# Patient Record
Sex: Female | Born: 1970
Health system: Southern US, Community
[De-identification: ages and names within clinical notes are randomized; demographics above are authoritative.]

## PROBLEM LIST (undated history)

## (undated) DIAGNOSIS — D649 Anemia, unspecified: Secondary | ICD-10-CM

## (undated) DIAGNOSIS — Z8759 Personal history of other complications of pregnancy, childbirth and the puerperium: Secondary | ICD-10-CM

## (undated) DIAGNOSIS — E559 Vitamin D deficiency, unspecified: Secondary | ICD-10-CM

## (undated) DIAGNOSIS — E119 Type 2 diabetes mellitus without complications: Secondary | ICD-10-CM

## (undated) DIAGNOSIS — J45909 Unspecified asthma, uncomplicated: Secondary | ICD-10-CM

## (undated) DIAGNOSIS — J309 Allergic rhinitis, unspecified: Secondary | ICD-10-CM

## (undated) DIAGNOSIS — N946 Dysmenorrhea, unspecified: Secondary | ICD-10-CM

## (undated) HISTORY — DX: Anemia, unspecified: D64.9

## (undated) HISTORY — PX: NECK EXPLORATION: SHX2077

## (undated) HISTORY — DX: Type 2 diabetes mellitus without complications: E11.9

## (undated) HISTORY — DX: Personal history of other complications of pregnancy, childbirth and the puerperium: Z87.59

## (undated) HISTORY — DX: Allergic rhinitis, unspecified: J30.9

## (undated) HISTORY — PX: KNEE ARTHROSCOPY: SHX127

## (undated) HISTORY — DX: Vitamin D deficiency, unspecified: E55.9

## (undated) HISTORY — PX: PARATHYROIDECTOMY: SHX19

## (undated) HISTORY — PX: TUBAL LIGATION: SHX77

## (undated) HISTORY — DX: Unspecified asthma, uncomplicated: J45.909

## (undated) HISTORY — PX: BREAST CYST ASPIRATION: SHX578

## (undated) HISTORY — DX: Dysmenorrhea, unspecified: N94.6

---

## 2006-02-16 ENCOUNTER — Inpatient Hospital Stay: Payer: Self-pay | Admitting: Obstetrics & Gynecology

## 2007-02-02 ENCOUNTER — Ambulatory Visit: Payer: Self-pay | Admitting: Family Medicine

## 2007-03-30 ENCOUNTER — Ambulatory Visit: Payer: Self-pay | Admitting: Obstetrics & Gynecology

## 2007-04-01 ENCOUNTER — Ambulatory Visit: Payer: Self-pay | Admitting: Family Medicine

## 2010-03-25 ENCOUNTER — Ambulatory Visit: Payer: Self-pay | Admitting: Unknown Physician Specialty

## 2011-01-08 ENCOUNTER — Ambulatory Visit: Payer: Self-pay | Admitting: Family Medicine

## 2011-07-21 ENCOUNTER — Ambulatory Visit: Payer: Self-pay | Admitting: Family Medicine

## 2012-07-26 ENCOUNTER — Ambulatory Visit: Payer: Self-pay | Admitting: Family Medicine

## 2013-09-06 ENCOUNTER — Ambulatory Visit: Payer: Self-pay | Admitting: Family Medicine

## 2013-11-08 ENCOUNTER — Ambulatory Visit: Payer: Self-pay | Admitting: Podiatry

## 2013-11-14 ENCOUNTER — Ambulatory Visit (INDEPENDENT_AMBULATORY_CARE_PROVIDER_SITE_OTHER): Payer: 59 | Admitting: Podiatry

## 2013-11-14 ENCOUNTER — Encounter: Payer: Self-pay | Admitting: Podiatry

## 2013-11-14 ENCOUNTER — Ambulatory Visit (INDEPENDENT_AMBULATORY_CARE_PROVIDER_SITE_OTHER): Payer: 59

## 2013-11-14 VITALS — BP 103/68 | HR 67 | Resp 16 | Ht 66.0 in | Wt 125.0 lb

## 2013-11-14 DIAGNOSIS — M722 Plantar fascial fibromatosis: Secondary | ICD-10-CM

## 2013-11-14 MED ORDER — MELOXICAM 15 MG PO TABS: 15.0000 mg | ORAL_TABLET | Freq: Every day | ORAL | Status: DC

## 2013-11-14 NOTE — Progress Notes (Signed)
   Subjective:    Patient ID: Tanya Adams, female    DOB: 12-23-1970, 43 y.o.   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 She has pain in her heel which started in August and has been improving since then. She states that she has pain in the area after running, walking,. His standing. She also states that she has pain in the mornings or after periods of rest. She has taken Mobic previously as well as icing and stretching. She denies any specific injury or trauma to the area. No with complaints at this time.  Patient states she currently has a cold.   Foot Pain Associated symptoms include coughing.      Review of Systems  HENT:       Sore throat  Respiratory: Positive for cough.   Musculoskeletal:       Joint pain Difficulty walking   Allergic/Immunologic: Positive for environmental allergies.  All other systems reviewed and are negative.      Objective:   Physical Exam AAO x3, NAD DP/PT pulses palpable bilaterally, CRT less than 3 seconds Protective sensation intact with Simms Weinstein monofilament, vibratory sensation intact, Achilles tendon reflex intact Tenderness to palpation over the plantar medial tubercle of the calcaneus and the left foot at the insertion the plantar fascia. There is no pain along the course of plantar fascial in the arch of the foot. There is no pain with lateral compression of the calcaneus or pain with vibratory sensation. No pain on the posterior aspect of the calcaneus or along the course of the Achilles tendon. No overlying edema, erythema, increased warmth. MMT 5/5, ROM WNL No open lesions. No calf pain, swelling, warmth, erythema.        Assessment & Plan:  43 year old female with left heel pain, likely plantar fasciitis. -X-rays were obtained and reviewed with the patient. -Conservative versus surgical treatment  discussed including alternatives, risks, complications. -At this time patient wishes to hold off a steroid injection as the area has been improving. -Discussed stretching exercises. -Ice to the affected area. -Discussed shoe gear modification the possible inserts. -Prescribed Mobic. Side effects the medication were discussed with the patient and directed to stop immediately if any are to occur and call the office. -Dispensed plantar fascial brace. -Follow-up in 3 weeks or sooner if any problems are to arise. In the meantime, call the office in the questions, concerns, change in symptoms.

## 2013-11-14 NOTE — Patient Instructions (Signed)
Plantar Fasciitis (Heel Spur Syndrome) with Rehab The plantar fascia is a fibrous, ligament-like, soft-tissue structure that spans the bottom of the foot. Plantar fasciitis is a condition that causes pain in the foot due to inflammation of the tissue. SYMPTOMS   Pain and tenderness on the underneath side of the foot.  Pain that worsens with standing or walking. CAUSES  Plantar fasciitis is caused by irritation and injury to the plantar fascia on the underneath side of the foot. Common mechanisms of injury include:  Direct trauma to bottom of the foot.  Damage to a small nerve that runs under the foot where the main fascia attaches to the heel bone.  Stress placed on the plantar fascia due to bone spurs. RISK INCREASES WITH:   Activities that place stress on the plantar fascia (running, jumping, pivoting, or cutting).  Poor strength and flexibility.  Improperly fitted shoes.  Tight calf muscles.  Flat feet.  Failure to warm-up properly before activity.  Obesity. PREVENTION  Warm up and stretch properly before activity.  Allow for adequate recovery between workouts.  Maintain physical fitness:  Strength, flexibility, and endurance.  Cardiovascular fitness.  Maintain a health body weight.  Avoid stress on the plantar fascia.  Wear properly fitted shoes, including arch supports for individuals who have flat feet. PROGNOSIS  If treated properly, then the symptoms of plantar fasciitis usually resolve without surgery. However, occasionally surgery is necessary. RELATED COMPLICATIONS   Recurrent symptoms that may result in a chronic condition.  Problems of the lower back that are caused by compensating for the injury, such as limping.  Pain or weakness of the foot during push-off following surgery.  Chronic inflammation, scarring, and partial or complete fascia tear, occurring more often from repeated injections. TREATMENT  Treatment initially involves the use of  ice and medication to help reduce pain and inflammation. The use of strengthening and stretching exercises may help reduce pain with activity, especially stretches of the Achilles tendon. These exercises may be performed at home or with a therapist. Your caregiver may recommend that you use heel cups of arch supports to help reduce stress on the plantar fascia. Occasionally, corticosteroid injections are given to reduce inflammation. If symptoms persist for greater than 6 months despite non-surgical (conservative), then surgery may be recommended.  MEDICATION   If pain medication is necessary, then nonsteroidal anti-inflammatory medications, such as aspirin and ibuprofen, or other minor pain relievers, such as acetaminophen, are often recommended.  Do not take pain medication within 7 days before surgery.  Prescription pain relievers may be given if deemed necessary by your caregiver. Use only as directed and only as much as you need.  Corticosteroid injections may be given by your caregiver. These injections should be reserved for the most serious cases, because they may only be given a certain number of times. HEAT AND COLD  Cold treatment (icing) relieves pain and reduces inflammation. Cold treatment should be applied for 10 to 15 minutes every 2 to 3 hours for inflammation and pain and immediately after any activity that aggravates your symptoms. Use ice packs or massage the area with a piece of ice (ice massage).  Heat treatment may be used prior to performing the stretching and strengthening activities prescribed by your caregiver, physical therapist, or athletic trainer. Use a heat pack or soak the injury in warm water. SEEK IMMEDIATE MEDICAL CARE IF:  Treatment seems to offer no benefit, or the condition worsens.  Any medications produce adverse side effects. EXERCISES RANGE   OF MOTION (ROM) AND STRETCHING EXERCISES - Plantar Fasciitis (Heel Spur Syndrome) These exercises may help you  when beginning to rehabilitate your injury. Your symptoms may resolve with or without further involvement from your physician, physical therapist or athletic trainer. While completing these exercises, remember:   Restoring tissue flexibility helps normal motion to return to the joints. This allows healthier, less painful movement and activity.  An effective stretch should be held for at least 30 seconds.  A stretch should never be painful. You should only feel a gentle lengthening or release in the stretched tissue. RANGE OF MOTION - Toe Extension, Flexion  Sit with your right / left leg crossed over your opposite knee.  Grasp your toes and gently pull them back toward the top of your foot. You should feel a stretch on the bottom of your toes and/or foot.  Hold this stretch for __________ seconds.  Now, gently pull your toes toward the bottom of your foot. You should feel a stretch on the top of your toes and or foot.  Hold this stretch for __________ seconds. Repeat __________ times. Complete this stretch __________ times per day.  RANGE OF MOTION - Ankle Dorsiflexion, Active Assisted  Remove shoes and sit on a chair that is preferably not on a carpeted surface.  Place right / left foot under knee. Extend your opposite leg for support.  Keeping your heel down, slide your right / left foot back toward the chair until you feel a stretch at your ankle or calf. If you do not feel a stretch, slide your bottom forward to the edge of the chair, while still keeping your heel down.  Hold this stretch for __________ seconds. Repeat __________ times. Complete this stretch __________ times per day.  STRETCH - Gastroc, Standing  Place hands on wall.  Extend right / left leg, keeping the front knee somewhat bent.  Slightly point your toes inward on your back foot.  Keeping your right / left heel on the floor and your knee straight, shift your weight toward the wall, not allowing your back to  arch.  You should feel a gentle stretch in the right / left calf. Hold this position for __________ seconds. Repeat __________ times. Complete this stretch __________ times per day. STRETCH - Soleus, Standing  Place hands on wall.  Extend right / left leg, keeping the other knee somewhat bent.  Slightly point your toes inward on your back foot.  Keep your right / left heel on the floor, bend your back knee, and slightly shift your weight over the back leg so that you feel a gentle stretch deep in your back calf.  Hold this position for __________ seconds. Repeat __________ times. Complete this stretch __________ times per day. STRETCH - Gastrocsoleus, Standing  Note: This exercise can place a lot of stress on your foot and ankle. Please complete this exercise only if specifically instructed by your caregiver.   Place the ball of your right / left foot on a step, keeping your other foot firmly on the same step.  Hold on to the wall or a rail for balance.  Slowly lift your other foot, allowing your body weight to press your heel down over the edge of the step.  You should feel a stretch in your right / left calf.  Hold this position for __________ seconds.  Repeat this exercise with a slight bend in your right / left knee. Repeat __________ times. Complete this stretch __________ times per day.    STRENGTHENING EXERCISES - Plantar Fasciitis (Heel Spur Syndrome)  These exercises may help you when beginning to rehabilitate your injury. They may resolve your symptoms with or without further involvement from your physician, physical therapist or athletic trainer. While completing these exercises, remember:   Muscles can gain both the endurance and the strength needed for everyday activities through controlled exercises.  Complete these exercises as instructed by your physician, physical therapist or athletic trainer. Progress the resistance and repetitions only as guided. STRENGTH -  Towel Curls  Sit in a chair positioned on a non-carpeted surface.  Place your foot on a towel, keeping your heel on the floor.  Pull the towel toward your heel by only curling your toes. Keep your heel on the floor.  If instructed by your physician, physical therapist or athletic trainer, add ____________________ at the end of the towel. Repeat __________ times. Complete this exercise __________ times per day. STRENGTH - Ankle Inversion  Secure one end of a rubber exercise band/tubing to a fixed object (table, pole). Loop the other end around your foot just before your toes.  Place your fists between your knees. This will focus your strengthening at your ankle.  Slowly, pull your big toe up and in, making sure the band/tubing is positioned to resist the entire motion.  Hold this position for __________ seconds.  Have your muscles resist the band/tubing as it slowly pulls your foot back to the starting position. Repeat __________ times. Complete this exercises __________ times per day.  Document Released: 12/29/2004 Document Revised: 03/23/2011 Document Reviewed: 04/12/2008 [employer] Patient Information 2015 [employer]. This information is not intended to replace advice given to you by your health care provider. Make sure you discuss any questions you have with your health care provider.  

## 2014-02-22 DIAGNOSIS — Z862 Personal history of diseases of the blood and blood-forming organs and certain disorders involving the immune mechanism: Secondary | ICD-10-CM | POA: Insufficient documentation

## 2014-03-19 ENCOUNTER — Ambulatory Visit: Payer: Self-pay | Admitting: Gastroenterology

## 2014-03-21 ENCOUNTER — Ambulatory Visit: Payer: Self-pay | Admitting: Gastroenterology

## 2014-04-18 LAB — BASIC METABOLIC PANEL
BUN: 9 mg/dL (ref 4–21)
Creatinine: 0.6 mg/dL (ref 0.5–1.1)
Glucose: 101 mg/dL
Potassium: 3.7 mmol/L (ref 3.4–5.3)
SODIUM: 144 mmol/L (ref 137–147)

## 2014-04-18 LAB — LIPID PANEL
Cholesterol: 159 mg/dL (ref 0–200)
HDL: 50 mg/dL (ref 35–70)
LDL Cholesterol: 99 mg/dL
Triglycerides: 51 mg/dL (ref 40–160)

## 2014-04-18 LAB — CBC AND DIFFERENTIAL
HEMATOCRIT: 37 % (ref 36–46)
Hemoglobin: 12.5 g/dL (ref 12.0–16.0)
NEUTROS ABS: 3 /uL
Platelets: 163 10*3/uL (ref 150–399)
WBC: 5.2 10^3/mL

## 2014-04-18 LAB — HEPATIC FUNCTION PANEL
ALT: 19 U/L (ref 7–35)
AST: 25 U/L (ref 13–35)
Alkaline Phosphatase: 32 U/L (ref 25–125)
BILIRUBIN, TOTAL: 0.7 mg/dL

## 2014-04-18 LAB — TSH: TSH: 1.44 u[IU]/mL (ref 0.41–5.90)

## 2014-07-31 ENCOUNTER — Telehealth: Payer: Self-pay | Admitting: Family Medicine

## 2014-07-31 ENCOUNTER — Other Ambulatory Visit: Payer: Self-pay

## 2014-07-31 DIAGNOSIS — J309 Allergic rhinitis, unspecified: Secondary | ICD-10-CM

## 2014-07-31 MED ORDER — AMOXICILLIN-POT CLAVULANATE 875-125 MG PO TABS: 1.0000 | ORAL_TABLET | Freq: Two times a day (BID) | ORAL | Status: DC

## 2014-07-31 MED ORDER — CIPROFLOXACIN HCL 500 MG PO TABS: 500.0000 mg | ORAL_TABLET | Freq: Two times a day (BID) | ORAL | Status: DC

## 2014-07-31 MED ORDER — MONTELUKAST SODIUM 10 MG PO TABS: 10.0000 mg | ORAL_TABLET | Freq: Every day | ORAL | Status: DC

## 2014-07-31 NOTE — Telephone Encounter (Signed)
Pt is requesting Augmenti 875 #20 and Cipro #14, she stated she was going to be gone to [place] for 2 wks.

## 2015-02-11 ENCOUNTER — Telehealth: Payer: Self-pay | Admitting: Family Medicine

## 2015-02-11 DIAGNOSIS — Z1239 Encounter for other screening for malignant neoplasm of breast: Secondary | ICD-10-CM

## 2015-02-11 NOTE — Telephone Encounter (Signed)
Order added.    Thanks,   -Laura  

## 2015-02-11 NOTE — Telephone Encounter (Signed)
Ok to put in order for screening mammogram. Thanks.

## 2015-02-13 ENCOUNTER — Ambulatory Visit
Admission: RE | Admit: 2015-02-13 | Discharge: 2015-02-13 | Disposition: A | Discharge status: Home / Self Care | Payer: 59 | Source: Ambulatory Visit | Attending: Family Medicine | Admitting: Family Medicine

## 2015-02-13 DIAGNOSIS — Z1231 Encounter for screening mammogram for malignant neoplasm of breast: Secondary | ICD-10-CM | POA: Insufficient documentation

## 2015-02-15 DIAGNOSIS — E892 Postprocedural hypoparathyroidism: Secondary | ICD-10-CM | POA: Insufficient documentation

## 2015-02-15 DIAGNOSIS — Z9851 Tubal ligation status: Secondary | ICD-10-CM | POA: Insufficient documentation

## 2015-02-15 DIAGNOSIS — E559 Vitamin D deficiency, unspecified: Secondary | ICD-10-CM | POA: Insufficient documentation

## 2015-02-15 DIAGNOSIS — Z9009 Acquired absence of other part of head and neck: Secondary | ICD-10-CM | POA: Insufficient documentation

## 2015-02-15 DIAGNOSIS — K2100 Gastro-esophageal reflux disease with esophagitis, without bleeding: Secondary | ICD-10-CM | POA: Insufficient documentation

## 2015-02-15 DIAGNOSIS — J45909 Unspecified asthma, uncomplicated: Secondary | ICD-10-CM | POA: Insufficient documentation

## 2015-02-15 DIAGNOSIS — D509 Iron deficiency anemia, unspecified: Secondary | ICD-10-CM | POA: Insufficient documentation

## 2015-02-15 DIAGNOSIS — M722 Plantar fascial fibromatosis: Secondary | ICD-10-CM | POA: Insufficient documentation

## 2015-03-12 ENCOUNTER — Ambulatory Visit (INDEPENDENT_AMBULATORY_CARE_PROVIDER_SITE_OTHER): Payer: 59 | Admitting: Family Medicine

## 2015-03-12 ENCOUNTER — Encounter: Payer: Self-pay | Admitting: Family Medicine

## 2015-03-12 VITALS — BP 96/62 | HR 67 | Temp 98.7°F | Resp 16 | Ht 64.0 in | Wt 128.0 lb

## 2015-03-12 DIAGNOSIS — E559 Vitamin D deficiency, unspecified: Secondary | ICD-10-CM | POA: Diagnosis not present

## 2015-03-12 DIAGNOSIS — Z0000 Encounter for general adult medical examination without abnormal findings: Secondary | ICD-10-CM

## 2015-03-12 NOTE — Progress Notes (Signed)
Patient ID: LOUINE SCHALK, female   DOB: November 15, 1970, 45 y.o.   MRN: [id number]       Patient: Tanya Adams, Female    DOB: November 09, 1970, 45 y.o.   MRN: [id number] Visit Date: 03/13/2015  Today's Provider: Margarita Rana, MD   Chief Complaint  Patient presents with  . Annual Exam   Subjective:    Annual physical exam Tanya Adams is a 45 y.o. female who presents today for health maintenance and complete physical. She feels well. She reports exercising 6 days a week. She reports she is sleeping well. 03/07/14 CPE 03/07/14 Pap-neg; HPV-neg 02/14/15 Mammogram-BI-RADS 1 03/19/14 Colonoscopy-normal  -----------------------------------------------------------------   Review of Systems  Constitutional: Negative.   HENT: Positive for congestion.   Eyes: Negative.   Respiratory: Negative.   Cardiovascular: Negative.   Gastrointestinal: Negative.   Endocrine: Negative.   Genitourinary: Negative.   Musculoskeletal: Positive for back pain.  Allergic/Immunologic: Positive for environmental allergies.  Neurological: Negative.   Hematological: Negative.   Psychiatric/Behavioral: Negative.     Social History      She  reports that she has never smoked. She has never used smokeless tobacco. She reports that she drinks alcohol. She reports that she does not use illicit drugs.       Social History   Social History  . Marital Status: Married    Spouse Name: N/A  . Number of Children: N/A  . Years of Education: N/A   Social History Main Topics  . Smoking status: Never Smoker   . Smokeless tobacco: Never Used  . Alcohol Use: 0.0 oz/week    0 Standard drinks or equivalent per week     Comment: seldom  . Drug Use: No  . Sexual Activity: Not Asked   Other Topics Concern  . None   Social History Narrative    History reviewed. No pertinent past medical history.   Patient Active Problem List   Diagnosis Date Noted  . Asthma with allergic rhinitis 02/15/2015  .  Esophagitis, reflux 02/15/2015  . H/O tubal ligation 02/15/2015  . Avitaminosis D 02/15/2015  . Allergic rhinitis 07/31/2014  . History of anemia 02/22/2014    Past Surgical History  Procedure Laterality Date  . Breast cyst aspiration Right   . Parathyroidectomy    . Neck exploration    . Knee arthroscopy    . Cesarean section    . Tubal ligation      Family History        Family Status  Relation Status Death Age  . Mother Alive   . Father Alive   . Brother Alive         Her family history includes Benign prostatic hyperplasia in her father; Depression in her mother; Diabetes in her father and mother; Hypertension in her mother. There is no history of Breast cancer.    Allergies  Allergen Reactions  . Shellfish Allergy Anaphylaxis  . Tape Rash    Previous Medications   CHOLECALCIFEROL (VITAMIN D) 2000 UNITS CAPS    Take by mouth.   FERROUS SULFATE 324 (65 FE) MG TBEC    Take by mouth.   FLUTICASONE (FLONASE) 50 MCG/ACT NASAL SPRAY    Place into the nose.   LEVALBUTEROL (XOPENEX HFA) 45 MCG/ACT INHALER    Inhale into the lungs.   LORATADINE (CLARITIN) 10 MG TABLET    Take 10 mg by mouth daily.   MONTELUKAST (SINGULAIR) 10 MG TABLET    Take 1 tablet (10 mg  total) by mouth daily.    Patient Care Team: Margarita Rana, MD as PCP - General (Family Medicine)     Objective:   Vitals: BP 96/62 mmHg  Pulse 67  Temp(Src) 98.7 F (37.1 C) (Oral)  Resp 16  Ht 5\' 4"  (1.626 m)  Wt 128 lb (58.06 kg)  BMI 21.96 kg/m2  SpO2 97%  LMP 03/05/2015   Physical Exam  Constitutional: She is oriented to person, place, and time. She appears well-developed and well-nourished.  HENT:  Head: Normocephalic and atraumatic.  Right Ear: Tympanic membrane, external ear and ear canal normal.  Left Ear: Tympanic membrane, external ear and ear canal normal.  Nose: Nose normal.  Mouth/Throat: Uvula is midline, oropharynx is clear and moist and mucous membranes are normal.  Eyes:  Conjunctivae, EOM and lids are normal. Pupils are equal, round, and reactive to light.  Neck: Trachea normal and normal range of motion. Neck supple. Carotid bruit is not present. No thyroid mass and no thyromegaly present.  Cardiovascular: Normal rate, regular rhythm and normal heart sounds.   Pulmonary/Chest: Effort normal and breath sounds normal.  Abdominal: Soft. Normal appearance and bowel sounds are normal. There is no hepatosplenomegaly. There is no tenderness.  Genitourinary: No breast swelling, tenderness or discharge.  Musculoskeletal: Normal range of motion.  Lymphadenopathy:    She has no cervical adenopathy.    She has no axillary adenopathy.  Neurological: She is alert and oriented to person, place, and time. She has normal strength. No cranial nerve deficit.  Skin: Skin is warm, dry and intact.  Psychiatric: She has a normal mood and affect. Her speech is normal and behavior is normal. Judgment and thought content normal. Cognition and memory are normal.      Assessment & Plan:     Routine Health Maintenance and Physical Exam  Exercise Activities and Dietary recommendations Goals    None      Immunization History  Administered Date(s) Administered  . Influenza-Unspecified 10/02/2014    1. Annual physical exam Healthy. No lifestyle risk factors identified.  Continue to eat healthy and exercise. Will check labs  Has history of anemia.   - CBC with Differential/Platelet - Comprehensive Metabolic Panel (CMET) - TSH  2. Vitamin D deficiency Has history of low Vitamin D. Will check labs.   - VITAMIN D 25 Hydroxy (Vit-D Deficiency, Fractures)   Patient was seen and examined by Jerrell Belfast, MD, and note scribed by Lynford Humphrey, [place].   I have reviewed the document for accuracy and completeness and I agree with above. Jerrell Belfast, MD   Margarita Rana, MD    --------------------------------------------------------------------

## 2015-04-01 ENCOUNTER — Encounter: Payer: Self-pay | Admitting: Family Medicine

## 2015-04-01 ENCOUNTER — Other Ambulatory Visit: Payer: Self-pay | Admitting: Family Medicine

## 2015-04-01 MED ORDER — BECLOMETHASONE DIPROPIONATE 40 MCG/ACT IN AERS: 2.0000 | INHALATION_SPRAY | Freq: Two times a day (BID) | RESPIRATORY_TRACT | Status: DC

## 2015-04-01 MED ORDER — LEVALBUTEROL TARTRATE 45 MCG/ACT IN AERO: 1.0000 | INHALATION_SPRAY | RESPIRATORY_TRACT | Status: DC | PRN

## 2015-04-03 ENCOUNTER — Ambulatory Visit (INDEPENDENT_AMBULATORY_CARE_PROVIDER_SITE_OTHER): Payer: 59 | Admitting: Family Medicine

## 2015-04-03 ENCOUNTER — Encounter: Payer: Self-pay | Admitting: Family Medicine

## 2015-04-03 VITALS — BP 104/66 | HR 72 | Temp 98.4°F | Resp 12 | Wt 128.0 lb

## 2015-04-03 DIAGNOSIS — J45901 Unspecified asthma with (acute) exacerbation: Secondary | ICD-10-CM | POA: Diagnosis not present

## 2015-04-03 MED ORDER — PREDNISONE 10 MG PO TABS: ORAL_TABLET | ORAL | Status: DC

## 2015-04-03 MED ORDER — AZITHROMYCIN 250 MG PO TABS: ORAL_TABLET | ORAL | Status: DC

## 2015-04-03 MED ORDER — MOMETASONE FUROATE 220 MCG/INH IN AEPB: 2.0000 | INHALATION_SPRAY | Freq: Two times a day (BID) | RESPIRATORY_TRACT | Status: DC

## 2015-04-03 MED ORDER — FLUCONAZOLE 150 MG PO TABS: 150.0000 mg | ORAL_TABLET | Freq: Once | ORAL | Status: DC

## 2015-04-03 NOTE — Progress Notes (Signed)
Patient ID: Tanya Adams, female   DOB: Jan 28, 1970, 45 y.o.   MRN: [id number]       Patient: Tanya Adams Female    DOB: [birth year]   45 y.o.   MRN: [id number] Visit Date: 04/03/2015  Today's Provider: Margarita Rana, MD   Chief Complaint  Patient presents with  . Cough   Subjective:    HPI  Patient states she developed a cold about 1 month ago. She started to feel better but then the past 2 days she started to cough more-mainly dry and constant, she has had some yellowish phlegm come up with cough and wheezing last night. No fever.  She did Pulmicort and Xopenex nebulizer. Did not pick up Qvar secondary to cost She states she hs history of allergies and asthma. She is still taking Claritin and Singulair. No fever. Feels fatigue and achy though. Getting worse again. Just can not clear it. Is sleeping but is keeping her husband up.       Allergies  Allergen Reactions  . Shellfish Allergy Anaphylaxis  . Tape Rash   Previous Medications   BUDESONIDE (PULMICORT) 0.5 MG/2ML NEBULIZER SOLUTION    Take 0.5 mg by nebulization 2 (two) times daily.   CHOLECALCIFEROL (VITAMIN D) 2000 UNITS CAPS    Take by mouth.   FERROUS SULFATE 324 (65 FE) MG TBEC    Take by mouth.   FLUTICASONE (FLONASE) 50 MCG/ACT NASAL SPRAY    Place into the nose.   LEVALBUTEROL (XOPENEX HFA) 45 MCG/ACT INHALER    Inhale 1 puff into the lungs every 4 (four) hours as needed for wheezing.   LORATADINE (CLARITIN) 10 MG TABLET    Take 10 mg by mouth daily.   MONTELUKAST (SINGULAIR) 10 MG TABLET    Take 1 tablet (10 mg total) by mouth daily.    Review of Systems  Constitutional: Positive for fatigue. Negative for fever and chills.  HENT: Positive for congestion, postnasal drip and rhinorrhea. Negative for sinus pressure, sneezing, sore throat and trouble swallowing.   Eyes: Positive for discharge.  Respiratory: Positive for cough and wheezing.   Cardiovascular: Negative.     Social History  Substance Use Topics   . Smoking status: Never Smoker   . Smokeless tobacco: Never Used  . Alcohol Use: 0.0 oz/week    0 Standard drinks or equivalent per week     Comment: seldom   Objective:   BP 104/66 mmHg  Pulse 72  Temp(Src) 98.4 F (36.9 C)  Resp 12  Wt 128 lb (58.06 kg)  SpO2 99%  LMP 04/03/2015  Physical Exam  Constitutional: She is oriented to person, place, and time. She appears well-developed and well-nourished.  HENT:  Head: Normocephalic and atraumatic.  Right Ear: External ear normal.  Left Ear: External ear normal.  Mouth/Throat: Oropharynx is clear and moist.  Eyes: Conjunctivae and EOM are normal. Pupils are equal, round, and reactive to light.  Neck: Normal range of motion. Neck supple.  Cardiovascular: Normal rate and regular rhythm.   Pulmonary/Chest: Effort normal and breath sounds normal.  Neurological: She is alert and oriented to person, place, and time.  Psychiatric: She has a normal mood and affect. Her behavior is normal. Judgment and thought content normal.      Assessment & Plan:     1. Asthma exacerbation New problem. Will treat as below.  May need daily steroid inhaler. Will write for high does Asmanex and taper as tolerated. Will also call in Prednisone and  Zpak and Diflucan. Patient instructed to call back if condition worsens or does not improve.    - mometasone (ASMANEX 120 METERED DOSES) 220 MCG/INH inhaler; Inhale 2 puffs into the lungs 2 (two) times daily.  Dispense: 1 Inhaler; Refill: 12 - predniSONE (DELTASONE) 10 MG tablet; 6 po for 1 day and then 5 po for 1 day and then 4 po for 1 day and 3 po for 1 day and then 2 po for 1 day and then 1 po for 1 day.  Dispense: 21 tablet; Refill: 0 - azithromycin (ZITHROMAX) 250 MG tablet; 2 today and then one a day for 4 more days.  Dispense: 6 tablet; Refill: 0 - fluconazole (DIFLUCAN) 150 MG tablet; Take 1 tablet (150 mg total) by mouth once.  Dispense: 1 tablet; Refill: 0   Margarita Rana, MD       Margarita Rana,  MD  [facility]

## 2015-04-17 DIAGNOSIS — E559 Vitamin D deficiency, unspecified: Secondary | ICD-10-CM | POA: Diagnosis not present

## 2015-04-17 DIAGNOSIS — Z0000 Encounter for general adult medical examination without abnormal findings: Secondary | ICD-10-CM | POA: Diagnosis not present

## 2015-04-18 LAB — VITAMIN D 25 HYDROXY (VIT D DEFICIENCY, FRACTURES): VIT D 25 HYDROXY: 34.8 ng/mL (ref 30.0–100.0)

## 2015-05-20 ENCOUNTER — Encounter: Payer: Self-pay | Admitting: Family Medicine

## 2015-08-22 ENCOUNTER — Ambulatory Visit (INDEPENDENT_AMBULATORY_CARE_PROVIDER_SITE_OTHER): Payer: 59 | Admitting: Podiatry

## 2015-08-22 ENCOUNTER — Encounter: Payer: Self-pay | Admitting: Podiatry

## 2015-08-22 ENCOUNTER — Ambulatory Visit (INDEPENDENT_AMBULATORY_CARE_PROVIDER_SITE_OTHER): Payer: 59

## 2015-08-22 VITALS — BP 103/60 | HR 75 | Resp 18

## 2015-08-22 DIAGNOSIS — R52 Pain, unspecified: Secondary | ICD-10-CM

## 2015-08-22 DIAGNOSIS — M79671 Pain in right foot: Secondary | ICD-10-CM | POA: Diagnosis not present

## 2015-08-22 DIAGNOSIS — M779 Enthesopathy, unspecified: Secondary | ICD-10-CM | POA: Diagnosis not present

## 2015-08-22 MED ORDER — MELOXICAM 15 MG PO TABS: 15.0000 mg | ORAL_TABLET | Freq: Every day | ORAL | 2 refills | Status: DC

## 2015-08-22 NOTE — Progress Notes (Signed)
   Subjective:    Patient ID: Tanya Adams, female    DOB: 05/01/1970, 45 y.o.   MRN: [id number]  HPI  Tanya Adams presents to the office today for concerns Of right foot pain which is been ongoing for about 1 week. She describes as a sharp pain at times. She states that she has increased her running over the last 4 weeks training for relay race. She states that she has decreased running of the last couple days her foot is felt better. She denies any swelling or redness over the area and no numbness or tingling. She states that when she tries to bring her foot up she gets some discomfort and she points the dorsal lateral midfoot. No recent treatment otherwise. No other complaints at this time.   Review of Systems  All other systems reviewed and are negative.      Objective:   Physical Exam General: AAO x3, NAD  Dermatological: Skin is warm, dry and supple bilateral. Nails x 10 are well manicured; remaining integument appears unremarkable at this time. There are no open sores, no preulcerative lesions, no rash or signs of infection present.  Vascular: Dorsalis Pedis artery and Posterior Tibial artery pedal pulses are 2/4 bilateral with immedate capillary fill time. Pedal hair growth present. There is no pain with calf compression, swelling, warmth, erythema.   Neruologic: Grossly intact via light touch bilateral.   Musculoskeletal:  There is tenderness palpation along the dorsal lateral aspect of the midfoot approximately metatarsal cuneiform joint. There is no area pinpoint enters there is no pain vibratory sensation. There is no edema, erythema, increased warmth. Tendons appear to be intact. No other areas of tenderness bilaterally. Muscular strength 5/5 in all groups tested bilateral.  Gait: Unassisted, Nonantalgic.       Assessment & Plan:  45 year old female presents with right foot pain, capsulitis, tendinitis -Treatment options discussed including all alternatives, risks, and  complications -Etiology of symptoms were discussed -X-rays were obtained and reviewed with the patient. No evidence of acute fracture or stress fracture identified at this time. -I discussed a steroid injection. Maxalt tenderness and she wishes to proceed. Under sterile conditions a mixture of dexamethasone and local anesthetic was infiltrated into the area of maximal tenderness without complications. Post injection care was discussed. -Dispensed surgical shoe wear the next couple of days to help rest the foot. -Ice to the area -Prescribed mobic. Discussed side effects of the medication and directed to stop if any are to occur and call the office.  -Follow-up in 2 weeks or sooner if any problems arise. In the meantime, encouraged to call the office with any questions, concerns, change in symptoms.   Celesta Gentile, DPM

## 2015-09-05 ENCOUNTER — Ambulatory Visit: Payer: 59 | Admitting: Podiatry

## 2015-11-26 ENCOUNTER — Ambulatory Visit: Payer: Self-pay | Admitting: Physician Assistant

## 2015-11-28 ENCOUNTER — Other Ambulatory Visit: Payer: Self-pay

## 2015-11-28 ENCOUNTER — Ambulatory Visit: Payer: Self-pay | Admitting: Physician Assistant

## 2015-11-28 DIAGNOSIS — R059 Cough, unspecified: Secondary | ICD-10-CM

## 2015-11-28 DIAGNOSIS — J40 Bronchitis, not specified as acute or chronic: Secondary | ICD-10-CM

## 2015-11-28 DIAGNOSIS — R05 Cough: Secondary | ICD-10-CM

## 2015-11-28 MED ORDER — AZITHROMYCIN 250 MG PO TABS: ORAL_TABLET | ORAL | 2 refills | Status: DC

## 2015-11-28 MED ORDER — FLUCONAZOLE 150 MG PO TABS: 150.0000 mg | ORAL_TABLET | Freq: Once | ORAL | 2 refills | Status: AC

## 2015-11-28 MED ORDER — BENZONATATE 100 MG PO CAPS: 100.0000 mg | ORAL_CAPSULE | Freq: Three times a day (TID) | ORAL | 2 refills | Status: DC

## 2016-01-23 ENCOUNTER — Other Ambulatory Visit: Payer: Self-pay

## 2016-01-23 DIAGNOSIS — Z1231 Encounter for screening mammogram for malignant neoplasm of breast: Secondary | ICD-10-CM

## 2016-02-25 ENCOUNTER — Ambulatory Visit
Admission: RE | Admit: 2016-02-25 | Discharge: 2016-02-25 | Disposition: A | Discharge status: Home / Self Care | Payer: 59 | Source: Ambulatory Visit | Attending: Family Medicine | Admitting: Family Medicine

## 2016-02-25 DIAGNOSIS — Z1231 Encounter for screening mammogram for malignant neoplasm of breast: Secondary | ICD-10-CM | POA: Diagnosis not present

## 2016-03-05 DIAGNOSIS — D649 Anemia, unspecified: Secondary | ICD-10-CM | POA: Diagnosis not present

## 2016-03-05 DIAGNOSIS — N920 Excessive and frequent menstruation with regular cycle: Secondary | ICD-10-CM | POA: Diagnosis not present

## 2016-03-05 DIAGNOSIS — Z01419 Encounter for gynecological examination (general) (routine) without abnormal findings: Secondary | ICD-10-CM | POA: Diagnosis not present

## 2016-03-05 LAB — HM PAP SMEAR: HM PAP: NEGATIVE

## 2016-05-07 ENCOUNTER — Telehealth: Payer: Self-pay | Admitting: Obstetrics and Gynecology

## 2016-05-07 NOTE — Telephone Encounter (Signed)
Pt is schedule for mirena insertion 07/15/12 at 2:39 with Elmo Putt Copland

## 2016-05-11 NOTE — Telephone Encounter (Signed)
Noted. Mirena stock reserved for this patient. 

## 2016-05-12 ENCOUNTER — Encounter: Payer: Self-pay | Admitting: Obstetrics and Gynecology

## 2016-05-12 ENCOUNTER — Ambulatory Visit (INDEPENDENT_AMBULATORY_CARE_PROVIDER_SITE_OTHER): Payer: 59 | Admitting: Obstetrics and Gynecology

## 2016-05-12 VITALS — BP 128/66 | HR 66 | Wt 124.0 lb

## 2016-05-12 DIAGNOSIS — Z30430 Encounter for insertion of intrauterine contraceptive device: Secondary | ICD-10-CM

## 2016-05-12 DIAGNOSIS — N920 Excessive and frequent menstruation with regular cycle: Secondary | ICD-10-CM

## 2016-05-12 NOTE — Telephone Encounter (Signed)
Per [facility] pt is on her cycle & R/S Mirena insertion to today w/[facility].

## 2016-05-12 NOTE — Progress Notes (Signed)
   Chief Complaint  Patient presents with  . IUD insert    Mirena     IUD PROCEDURE NOTE:  Tanya Adams is a 46 y.o. [phone] here for Mirena  IUD insertion for menorrhagia. Discussed with AS at her annual 2/18. No GYN concerns.  Last pap smear was normal.  BP 128/66 (BP Location: Left Arm, Patient Position: Sitting, Cuff Size: Normal)   Pulse 66   Wt 124 lb (56.2 kg)   LMP 05/08/2016 (Exact Date)   BMI 21.28 kg/m   IUD Insertion Procedure Note Patient identified, informed consent performed, consent signed.   Discussed risks of irregular bleeding, cramping, infection, malpositioning or misplacement of the IUD outside the uterus which may require further procedure such as laparoscopy, risk of failure <1%. Time out was performed.    A bimanual exam showed the uterus to be retroverted.  Speculum placed in the vagina.  Cervix visualized.  Cleaned with Betadine x 2.  Grasped anteriorly with a single tooth tenaculum.  Uterus sounded to 6.5 cm.   IUD placed per manufacturer's recommendations.  Strings trimmed to 3 cm. Tenaculum was removed, good hemostasis noted.  Patient tolerated procedure well.   ASSESSMENT: IUD insertion Encounter for insertion of intrauterine contraceptive device (IUD)  Menorrhagia with regular cycle   Plan:  Patient was given post-procedure instructions.  She was advised to have backup contraception for one week.   Call if you are having increasing pain, cramps or bleeding or if you have a fever greater than 100.4 degrees F., shaking chills, nausea or vomiting. Patient was also asked to check IUD strings periodically and follow up in 4 weeks for IUD check.  Return in about 4 weeks (around 06/09/2016) for IUD check.  Tekeyah Santiago B. Andrius Andrepont, PA-C 05/12/2016 11:40 AM

## 2016-05-12 NOTE — Patient Instructions (Signed)

## 2016-05-12 NOTE — Telephone Encounter (Signed)
Pt came for Mirena insertion

## 2016-05-14 ENCOUNTER — Ambulatory Visit: Payer: 59 | Admitting: Obstetrics and Gynecology

## 2016-06-26 ENCOUNTER — Other Ambulatory Visit: Payer: Self-pay

## 2016-07-02 ENCOUNTER — Ambulatory Visit: Payer: 59 | Admitting: Obstetrics and Gynecology

## 2016-07-02 DIAGNOSIS — H5213 Myopia, bilateral: Secondary | ICD-10-CM | POA: Diagnosis not present

## 2016-07-07 ENCOUNTER — Telehealth: Payer: Self-pay | Admitting: Physician Assistant

## 2016-07-07 DIAGNOSIS — J4540 Moderate persistent asthma, uncomplicated: Secondary | ICD-10-CM

## 2016-07-07 MED ORDER — MONTELUKAST SODIUM 10 MG PO TABS: 10.0000 mg | ORAL_TABLET | Freq: Every day | ORAL | 3 refills | Status: DC

## 2016-07-07 NOTE — Telephone Encounter (Signed)
Montelukast refilled

## 2016-09-03 ENCOUNTER — Ambulatory Visit (INDEPENDENT_AMBULATORY_CARE_PROVIDER_SITE_OTHER): Payer: 59 | Admitting: Obstetrics and Gynecology

## 2016-09-03 ENCOUNTER — Encounter: Payer: Self-pay | Admitting: Obstetrics and Gynecology

## 2016-09-03 VITALS — BP 100/60 | HR 67 | Ht 64.0 in | Wt 125.0 lb

## 2016-09-03 DIAGNOSIS — Z30431 Encounter for routine checking of intrauterine contraceptive device: Secondary | ICD-10-CM | POA: Diagnosis not present

## 2016-09-03 NOTE — Progress Notes (Signed)
   Chief Complaint  Patient presents with  . Contraception    IUD check     History of Present Illness:  Tanya Adams is a 46 y.o. that had a Mirena IUD placed approximately 3 months ago. Since that time, she has done well. She is still having monthly periods that are light and a few days. No BTB. She had IUD placed for menorrhagia so sx much improved. No dysmen, dyspareunia. She has noticed decreased libido since the IUD, and breast tenderness initially. She is happy with the IUD overall.  Review of Systems  Constitutional: Negative for fever.  Gastrointestinal: Negative for blood in stool, constipation, diarrhea, nausea and vomiting.  Genitourinary: Negative for dyspareunia, dysuria, flank pain, frequency, hematuria, urgency, vaginal bleeding, vaginal discharge and vaginal pain.  Musculoskeletal: Negative for back pain.  Skin: Negative for rash.    Physical Exam:  BP 100/60   Pulse 67   Ht 5\' 4"  (1.626 m)   Wt 125 lb (56.7 kg)   LMP 07/30/2016 (Exact Date)   BMI 21.46 kg/m  Body mass index is 21.46 kg/m.  Pelvic exam:  Two IUD strings present seen coming from the cervical os. EGBUS, vaginal vault and cervix: within normal limits   Assessment:  Routine checking of IUD Encounter for routine checking of intrauterine contraceptive device (IUD)    IUD strings present in proper location; pt doing well  Plan: F/u if any signs of infection or can no longer feel the strings.   Eternity Dexter B. Yassmine Tamm, PA-C 09/03/2016 1:52 PM

## 2016-10-06 ENCOUNTER — Ambulatory Visit (INDEPENDENT_AMBULATORY_CARE_PROVIDER_SITE_OTHER): Payer: 59 | Admitting: Family Medicine

## 2016-10-06 DIAGNOSIS — Z23 Encounter for immunization: Secondary | ICD-10-CM | POA: Diagnosis not present

## 2016-11-03 ENCOUNTER — Ambulatory Visit (INDEPENDENT_AMBULATORY_CARE_PROVIDER_SITE_OTHER): Payer: 59

## 2016-11-03 ENCOUNTER — Ambulatory Visit: Payer: 59 | Admitting: Podiatry

## 2016-11-03 ENCOUNTER — Encounter: Payer: Self-pay | Admitting: Podiatry

## 2016-11-03 ENCOUNTER — Ambulatory Visit (INDEPENDENT_AMBULATORY_CARE_PROVIDER_SITE_OTHER): Payer: 59 | Admitting: Podiatry

## 2016-11-03 DIAGNOSIS — M778 Other enthesopathies, not elsewhere classified: Secondary | ICD-10-CM

## 2016-11-03 DIAGNOSIS — M779 Enthesopathy, unspecified: Principal | ICD-10-CM

## 2016-11-03 DIAGNOSIS — M79672 Pain in left foot: Secondary | ICD-10-CM | POA: Diagnosis not present

## 2016-11-03 DIAGNOSIS — M7751 Other enthesopathy of right foot: Secondary | ICD-10-CM

## 2016-11-03 DIAGNOSIS — M79671 Pain in right foot: Secondary | ICD-10-CM

## 2016-11-05 ENCOUNTER — Ambulatory Visit (INDEPENDENT_AMBULATORY_CARE_PROVIDER_SITE_OTHER): Payer: 59 | Admitting: Orthotics

## 2016-11-05 DIAGNOSIS — M779 Enthesopathy, unspecified: Secondary | ICD-10-CM

## 2016-11-05 DIAGNOSIS — M7751 Other enthesopathy of right foot: Secondary | ICD-10-CM

## 2016-11-05 DIAGNOSIS — M79671 Pain in right foot: Secondary | ICD-10-CM

## 2016-11-05 DIAGNOSIS — M778 Other enthesopathies, not elsewhere classified: Secondary | ICD-10-CM

## 2016-11-05 NOTE — Progress Notes (Signed)
   HPI: 46 year old female presenting today with a new complaint of pain to the lateral side of the right foot that began approximately one month ago. Patient states she is a runner. She states it feels as if she is stepping on something when she walks. She has not done anything to treat the symptoms. She is here for further evaluation and treatment.   Past Medical History:  Diagnosis Date  . Allergic rhinitis   . Anemia   . Asthma   . Dysmenorrhea   . History of miscarriage      Physical Exam: General: The patient is alert and oriented x3 in no acute distress.  Dermatology: Skin is warm, dry and supple bilateral lower extremities. Negative for open lesions or macerations.  Vascular: Palpable pedal pulses bilaterally. No edema or erythema noted. Capillary refill within normal limits.  Neurological: Epicritic and protective threshold grossly intact bilaterally.   Musculoskeletal Exam: Pain with palpation to the right foot. Range of motion within normal limits to all pedal and ankle joints bilateral. Muscle strength 5/5 in all groups bilateral.   Radiographic Exam:  Normal osseous mineralization. Joint spaces preserved. No fracture/dislocation/boney destruction.    Assessment: - Right foot capsulitis   Plan of Care:  - Patient evaluated. X-rays reviewed. - Appointment with Liliane Channel for custom molded orthotics. - Return to clinic when necessary.   Edrick Kins, DPM [facility]  Dr. Edrick Kins, DPM    [address], [place] [address]                Office [phone]  Fax [phone]

## 2016-11-05 NOTE — Progress Notes (Signed)
Patient presents w/ stress fx 4th met base R.  Patietn is runner and wants soft f/o w/ lateral column cushioning R. Plan on Richy to fab.

## 2016-11-12 ENCOUNTER — Ambulatory Visit: Payer: 59 | Admitting: Podiatry

## 2016-12-09 ENCOUNTER — Other Ambulatory Visit: Payer: 59 | Admitting: Orthotics

## 2016-12-15 ENCOUNTER — Ambulatory Visit: Payer: 59 | Admitting: Orthotics

## 2016-12-15 DIAGNOSIS — M779 Enthesopathy, unspecified: Secondary | ICD-10-CM

## 2016-12-15 DIAGNOSIS — M778 Other enthesopathies, not elsewhere classified: Secondary | ICD-10-CM

## 2016-12-15 NOTE — Progress Notes (Signed)
Patient came in today to pick up custom made foot orthotics.  The goals were accomplished and the patient reported no dissatisfaction with said orthotics.  Patient was advised of breakin period and how to report any issues. 

## 2017-01-28 ENCOUNTER — Other Ambulatory Visit: Payer: Self-pay | Admitting: Family Medicine

## 2017-01-28 ENCOUNTER — Other Ambulatory Visit: Payer: Self-pay | Admitting: Obstetrics and Gynecology

## 2017-01-28 DIAGNOSIS — Z1231 Encounter for screening mammogram for malignant neoplasm of breast: Secondary | ICD-10-CM

## 2017-02-25 ENCOUNTER — Telehealth: Payer: Self-pay | Admitting: Family Medicine

## 2017-02-25 DIAGNOSIS — Z20828 Contact with and (suspected) exposure to other viral communicable diseases: Secondary | ICD-10-CM

## 2017-02-25 MED ORDER — OSELTAMIVIR PHOSPHATE 75 MG PO CAPS: 75.0000 mg | ORAL_CAPSULE | Freq: Every day | ORAL | 0 refills | Status: DC

## 2017-02-25 NOTE — Telephone Encounter (Signed)
Pt would like prescription for Tamiflu sent to [facility].She has been exposed through family member

## 2017-02-25 NOTE — Telephone Encounter (Signed)
Please advise. Thanks.  

## 2017-02-25 NOTE — Telephone Encounter (Signed)
Pt advised. Med sent in.

## 2017-02-25 NOTE — Telephone Encounter (Signed)
OK--75mg  daily for 5 day

## 2017-03-11 ENCOUNTER — Ambulatory Visit
Admission: RE | Admit: 2017-03-11 | Discharge: 2017-03-11 | Disposition: A | Discharge status: Home / Self Care | Payer: 59 | Source: Ambulatory Visit | Attending: Obstetrics and Gynecology | Admitting: Obstetrics and Gynecology

## 2017-03-11 ENCOUNTER — Encounter: Payer: Self-pay | Admitting: Family Medicine

## 2017-03-11 ENCOUNTER — Ambulatory Visit (INDEPENDENT_AMBULATORY_CARE_PROVIDER_SITE_OTHER): Payer: 59 | Admitting: Family Medicine

## 2017-03-11 VITALS — BP 100/72 | HR 65 | Temp 98.2°F | Resp 16 | Ht 64.0 in | Wt 123.0 lb

## 2017-03-11 DIAGNOSIS — Z862 Personal history of diseases of the blood and blood-forming organs and certain disorders involving the immune mechanism: Secondary | ICD-10-CM

## 2017-03-11 DIAGNOSIS — R739 Hyperglycemia, unspecified: Secondary | ICD-10-CM

## 2017-03-11 DIAGNOSIS — Z0000 Encounter for general adult medical examination without abnormal findings: Secondary | ICD-10-CM

## 2017-03-11 DIAGNOSIS — Z1231 Encounter for screening mammogram for malignant neoplasm of breast: Secondary | ICD-10-CM | POA: Diagnosis not present

## 2017-03-11 DIAGNOSIS — E892 Postprocedural hypoparathyroidism: Secondary | ICD-10-CM

## 2017-03-11 DIAGNOSIS — Z9889 Other specified postprocedural states: Secondary | ICD-10-CM | POA: Diagnosis not present

## 2017-03-11 DIAGNOSIS — E559 Vitamin D deficiency, unspecified: Secondary | ICD-10-CM | POA: Diagnosis not present

## 2017-03-11 DIAGNOSIS — Z9009 Acquired absence of other part of head and neck: Secondary | ICD-10-CM

## 2017-03-11 NOTE — Patient Instructions (Signed)
Preventive Care 40-64 Years, Female Preventive care refers to lifestyle choices and visits with your health care provider that can promote health and wellness. What does preventive care include?  A yearly physical exam. This is also called an annual well check.  Dental exams once or twice a year.  Routine eye exams. Ask your health care provider how often you should have your eyes checked.  Personal lifestyle choices, including: ? Daily care of your teeth and gums. ? Regular physical activity. ? Eating a healthy diet. ? Avoiding tobacco and drug use. ? Limiting alcohol use. ? Practicing safe sex. ? Taking low-dose aspirin daily starting at age 58. ? Taking vitamin and mineral supplements as recommended by your health care provider. What happens during an annual well check? The services and screenings done by your health care provider during your annual well check will depend on your age, overall health, lifestyle risk factors, and family history of disease. Counseling Your health care provider may ask you questions about your:  Alcohol use.  Tobacco use.  Drug use.  Emotional well-being.  Home and relationship well-being.  Sexual activity.  Eating habits.  Work and work Statistician.  Method of birth control.  Menstrual cycle.  Pregnancy history.  Screening You may have the following tests or measurements:  Height, weight, and BMI.  Blood pressure.  Lipid and cholesterol levels. These may be checked every 5 years, or more frequently if you are over 81 years old.  Skin check.  Lung cancer screening. You may have this screening every year starting at age 78 if you have a 30-pack-year history of smoking and currently smoke or have quit within the past 15 years.  Fecal occult blood test (FOBT) of the stool. You may have this test every year starting at age 65.  Flexible sigmoidoscopy or colonoscopy. You may have a sigmoidoscopy every 5 years or a colonoscopy  every 10 years starting at age 30.  Hepatitis C blood test.  Hepatitis B blood test.  Sexually transmitted disease (STD) testing.  Diabetes screening. This is done by checking your blood sugar (glucose) after you have not eaten for a while (fasting). You may have this done every 1-3 years.  Mammogram. This may be done every 1-2 years. Talk to your health care provider about when you should start having regular mammograms. This may depend on whether you have a family history of breast cancer.  BRCA-related cancer screening. This may be done if you have a family history of breast, ovarian, tubal, or peritoneal cancers.  Pelvic exam and Pap test. This may be done every 3 years starting at age 80. Starting at age 36, this may be done every 5 years if you have a Pap test in combination with an HPV test.  Bone density scan. This is done to screen for osteoporosis. You may have this scan if you are at high risk for osteoporosis.  Discuss your test results, treatment options, and if necessary, the need for more tests with your health care provider. Vaccines Your health care provider may recommend certain vaccines, such as:  Influenza vaccine. This is recommended every year.  Tetanus, diphtheria, and acellular pertussis (Tdap, Td) vaccine. You may need a Td booster every 10 years.  Varicella vaccine. You may need this if you have not been vaccinated.  Zoster vaccine. You may need this after age 5.  Measles, mumps, and rubella (MMR) vaccine. You may need at least one dose of MMR if you were born in  1957 or later. You may also need a second dose.  Pneumococcal 13-valent conjugate (PCV13) vaccine. You may need this if you have certain conditions and were not previously vaccinated.  Pneumococcal polysaccharide (PPSV23) vaccine. You may need one or two doses if you smoke cigarettes or if you have certain conditions.  Meningococcal vaccine. You may need this if you have certain  conditions.  Hepatitis A vaccine. You may need this if you have certain conditions or if you travel or work in places where you may be exposed to hepatitis A.  Hepatitis B vaccine. You may need this if you have certain conditions or if you travel or work in places where you may be exposed to hepatitis B.  Haemophilus influenzae type b (Hib) vaccine. You may need this if you have certain conditions.  Talk to your health care provider about which screenings and vaccines you need and how often you need them. This information is not intended to replace advice given to you by your health care provider. Make sure you discuss any questions you have with your health care provider. Document Released: 01/25/2015 Document Revised: 09/18/2015 Document Reviewed: 10/30/2014 Elsevier Interactive Patient Education  2018 Elsevier Inc.  

## 2017-03-11 NOTE — Progress Notes (Signed)
Patient: Tanya Adams, Female    DOB: [birth year], 47 y.o.   MRN: [id number] Visit Date: 03/11/2017  Today's Provider: Lavon Paganini, MD   I, Martha Clan, CMA, am acting as scribe for Lavon Paganini, MD.  Chief Complaint  Patient presents with  . Annual Exam  . Asthma   Subjective:    Annual physical exam Tanya Adams is a 47 y.o. female who presents today for health maintenance and complete physical. She feels well. She reports exercising 6 days per week for 80 minutes; running and yoga. She reports she is sleeping fairly well.  Last mammogram- done today; not resulted Last pap- at [facility] per pt; WNL. ----------------------------------------------------------------- H/o parathyroidectomy 2/2 hyperparathyroidism found 2/2 chronic hyperCa. Never had BMD testing after surgery.  Taking Vit D supplements  Review of Systems  Constitutional: Negative.   HENT: Positive for sneezing. Negative for congestion, dental problem, drooling, ear discharge, ear pain, facial swelling, hearing loss, mouth sores, nosebleeds, postnasal drip, rhinorrhea, sinus pressure, sinus pain, sore throat, tinnitus, trouble swallowing and voice change.   Eyes: Negative.   Respiratory: Positive for cough. Negative for apnea, choking, chest tightness, shortness of breath, wheezing and stridor.   Cardiovascular: Negative.   Gastrointestinal: Negative.   Endocrine: Negative.   Genitourinary: Negative.   Musculoskeletal: Positive for neck pain. Negative for arthralgias, back pain, gait problem, joint swelling, myalgias and neck stiffness.  Skin: Negative.   Allergic/Immunologic: Positive for environmental allergies and food allergies. Negative for immunocompromised state.  Neurological: Negative.   Hematological: Negative.   Psychiatric/Behavioral: Negative.     Social History      She  reports that  has never smoked. she has never used smokeless tobacco. She reports that she  drinks alcohol. She reports that she does not use drugs.       Social History   Socioeconomic History  . Marital status: Married    Spouse name: None  . Number of children: 2  . Years of education: None  . Highest education level: Doctorate  Social Needs  . Financial resource strain: None  . Food insecurity - worry: None  . Food insecurity - inability: None  . Transportation needs - medical: None  . Transportation needs - non-medical: None  Occupational History  . Occupation: PHYSICIAN    Employer: [employer]  Tobacco Use  . Smoking status: Never Smoker  . Smokeless tobacco: Never Used  Substance and Sexual Activity  . Alcohol use: Yes    Alcohol/week: 0.0 oz    Comment: seldom  . Drug use: No  . Sexual activity: Yes    Birth control/protection: Surgical, IUD  Other Topics Concern  . None  Social History Narrative  . None    Past Medical History:  Diagnosis Date  . Allergic rhinitis   . Anemia   . Asthma   . Dysmenorrhea   . History of miscarriage      Patient Active Problem List   Diagnosis Date Noted  . Asthma exacerbation 04/03/2015  . Asthma with allergic rhinitis 02/15/2015  . Esophagitis, reflux 02/15/2015  . H/O tubal ligation 02/15/2015  . Avitaminosis D 02/15/2015  . Allergic rhinitis 07/31/2014  . History of anemia 02/22/2014    Past Surgical History:  Procedure Laterality Date  . BREAST CYST ASPIRATION Right   . CESAREAN SECTION    . KNEE ARTHROSCOPY    . NECK EXPLORATION    . PARATHYROIDECTOMY    . TUBAL  LIGATION      Family History        Family Status  Relation Name Status  . Mother  Alive  . Father  Alive  . Brother  Alive  . Neg Hx  (Not Specified)        Her family history includes Benign prostatic hyperplasia in her father; Depression in her mother; Diabetes in her father and mother; Healthy in her brother; Heart disease in her father; Hypertension in her mother. There is no history of Breast cancer, Colon  cancer, or Ovarian cancer.      Allergies  Allergen Reactions  . Shellfish Allergy Anaphylaxis  . Tape Rash     Current Outpatient Medications:  .  Cholecalciferol (VITAMIN D) 2000 units CAPS, Take by mouth., Disp: , Rfl:  .  loratadine (CLARITIN) 10 MG tablet, Take 10 mg by mouth daily., Disp: , Rfl:  .  fluticasone (FLONASE) 50 MCG/ACT nasal spray, Place into the nose., Disp: , Rfl:  .  montelukast (SINGULAIR) 10 MG tablet, Take 1 tablet (10 mg total) by mouth daily. (Patient not taking: Reported on 03/11/2017), Disp: 90 tablet, Rfl: 3   Patient Care Team: Margarita Rana, MD as PCP - General (Family Medicine)      Objective:   Vitals: BP 100/72 (BP Location: Left Arm, Patient Position: Sitting, Cuff Size: Normal)   Pulse 65   Temp 98.2 F (36.8 C) (Oral)   Resp 16   Ht 5\' 4"  (1.626 m)   Wt 123 lb (55.8 kg)   SpO2 99%   BMI 21.11 kg/m    Vitals:   03/11/17 1412  BP: 100/72  Pulse: 65  Resp: 16  Temp: 98.2 F (36.8 C)  TempSrc: Oral  SpO2: 99%  Weight: 123 lb (55.8 kg)  Height: 5\' 4"  (1.626 m)     Physical Exam  Constitutional: She is oriented to person, place, and time. She appears well-developed and well-nourished. No distress.  HENT:  Head: Normocephalic and atraumatic.  Right Ear: External ear normal.  Left Ear: External ear normal.  Nose: Nose normal.  Mouth/Throat: Oropharynx is clear and moist.  Eyes: Conjunctivae and EOM are normal. Pupils are equal, round, and reactive to light. No scleral icterus.  Neck: Neck supple. No thyromegaly present.  Cardiovascular: Normal rate, regular rhythm, normal heart sounds and intact distal pulses.  No murmur heard. Pulmonary/Chest: Effort normal and breath sounds normal. No respiratory distress. She has no wheezes. She has no rales.  Abdominal: Soft. Bowel sounds are normal. She exhibits no distension. There is no tenderness. There is no rebound and no guarding.  Musculoskeletal: She exhibits no edema or deformity.   Lymphadenopathy:    She has no cervical adenopathy.  Neurological: She is alert and oriented to person, place, and time.  Skin: Skin is warm and dry. No rash noted.  Psychiatric: She has a normal mood and affect. Her behavior is normal.  Vitals reviewed.    Depression Screen PHQ 2/9 Scores 03/11/2017  PHQ - 2 Score 0     Assessment & Plan:     Routine Health Maintenance and Physical Exam  Exercise Activities and Dietary recommendations Goals    None      Immunization History  Administered Date(s) Administered  . Influenza,inj,Quad PF,6+ Mos 10/06/2016  . Influenza-Unspecified 10/02/2014    Health Maintenance  Topic Date Due  . HIV Screening  03/03/1986  . TETANUS/TDAP  03/03/1990  . PAP SMEAR  03/03/1992  . INFLUENZA VACCINE  Completed  Will request OB/gyn records [facility]) for last pap smear and HIV screening.  Discussed health benefits of physical activity, and encouraged her to engage in regular exercise appropriate for her age and condition.    -------------------------------------------------------------------- Problem List Items Addressed This Visit      Other   History of anemia   Relevant Orders   Fe+TIBC+Fer   CBC   H/O parathyroidectomy   Relevant Orders   TSH   VITAMIN D 25 Hydroxy (Vit-D Deficiency, Fractures)   DG Bone Density   Avitaminosis D   Relevant Orders   VITAMIN D 25 Hydroxy (Vit-D Deficiency, Fractures)    Other Visit Diagnoses    Encounter for annual physical exam    -  Primary   Relevant Orders   Lipid panel   Comprehensive metabolic panel   TSH   Hyperglycemia       Relevant Orders   Hemoglobin A1c       Return in about 1 year (around 03/11/2018) for physical.   The entirety of the information documented in the History of Present Illness, Review of Systems and Physical Exam were personally obtained by me. Portions of this information were initially documented by Raquel Sarna Ratchford, CMA and reviewed by me for  thoroughness and accuracy.    Virginia Crews, MD, MPH [facility] 03/11/2017 2:35 PM

## 2017-03-12 DIAGNOSIS — Z862 Personal history of diseases of the blood and blood-forming organs and certain disorders involving the immune mechanism: Secondary | ICD-10-CM | POA: Diagnosis not present

## 2017-03-12 DIAGNOSIS — Z0000 Encounter for general adult medical examination without abnormal findings: Secondary | ICD-10-CM | POA: Diagnosis not present

## 2017-03-12 DIAGNOSIS — Z9889 Other specified postprocedural states: Secondary | ICD-10-CM | POA: Diagnosis not present

## 2017-03-12 DIAGNOSIS — E559 Vitamin D deficiency, unspecified: Secondary | ICD-10-CM | POA: Diagnosis not present

## 2017-03-12 DIAGNOSIS — R739 Hyperglycemia, unspecified: Secondary | ICD-10-CM | POA: Diagnosis not present

## 2017-03-13 LAB — LIPID PANEL
CHOLESTEROL TOTAL: 158 mg/dL (ref 100–199)
Chol/HDL Ratio: 3 ratio (ref 0.0–4.4)
HDL: 53 mg/dL (ref >39)
LDL CALC: 98 mg/dL (ref 0–99)
Triglycerides: 35 mg/dL (ref 0–149)
VLDL CHOLESTEROL CAL: 7 mg/dL (ref 5–40)

## 2017-03-13 LAB — CBC
HEMATOCRIT: 38.6 % (ref 34.0–46.6)
HEMOGLOBIN: 13.1 g/dL (ref 11.1–15.9)
MCH: 30.3 pg (ref 26.6–33.0)
MCHC: 33.9 g/dL (ref 31.5–35.7)
MCV: 89 fL (ref 79–97)
Platelets: 172 10*3/uL (ref 150–379)
RBC: 4.32 x10E6/uL (ref 3.77–5.28)
RDW: 12.8 % (ref 12.3–15.4)
WBC: 7.8 10*3/uL (ref 3.4–10.8)

## 2017-03-13 LAB — COMPREHENSIVE METABOLIC PANEL
A/G RATIO: 1.9 (ref 1.2–2.2)
ALK PHOS: 40 IU/L (ref 39–117)
ALT: 21 IU/L (ref 0–32)
AST: 22 IU/L (ref 0–40)
Albumin: 4.3 g/dL (ref 3.5–5.5)
BUN/Creatinine Ratio: 18 (ref 9–23)
BUN: 12 mg/dL (ref 6–24)
Bilirubin Total: 0.7 mg/dL (ref 0.0–1.2)
CALCIUM: 8.9 mg/dL (ref 8.7–10.2)
CO2: 23 mmol/L (ref 20–29)
CREATININE: 0.68 mg/dL (ref 0.57–1.00)
Chloride: 104 mmol/L (ref 96–106)
GFR calc Af Amer: 121 mL/min/{1.73_m2} (ref >59)
GFR, EST NON AFRICAN AMERICAN: 105 mL/min/{1.73_m2} (ref >59)
GLOBULIN, TOTAL: 2.3 g/dL (ref 1.5–4.5)
Glucose: 89 mg/dL (ref 65–99)
POTASSIUM: 3.9 mmol/L (ref 3.5–5.2)
SODIUM: 144 mmol/L (ref 134–144)
Total Protein: 6.6 g/dL (ref 6.0–8.5)

## 2017-03-13 LAB — IRON,TIBC AND FERRITIN PANEL
Ferritin: 52 ng/mL (ref 15–150)
Iron Saturation: 25 % (ref 15–55)
Iron: 81 ug/dL (ref 27–159)
TIBC: 320 ug/dL (ref 250–450)
UIBC: 239 ug/dL (ref 131–425)

## 2017-03-13 LAB — HEMOGLOBIN A1C
Est. average glucose Bld gHb Est-mCnc: 126 mg/dL
HEMOGLOBIN A1C: 6 % — AB (ref 4.8–5.6)

## 2017-03-13 LAB — VITAMIN D 25 HYDROXY (VIT D DEFICIENCY, FRACTURES): VIT D 25 HYDROXY: 36.5 ng/mL (ref 30.0–100.0)

## 2017-03-13 LAB — TSH: TSH: 1.54 u[IU]/mL (ref 0.450–4.500)

## 2017-03-15 ENCOUNTER — Telehealth: Payer: Self-pay

## 2017-03-15 NOTE — Telephone Encounter (Signed)
Left message advising pt. OK per DPR. 

## 2017-03-15 NOTE — Telephone Encounter (Signed)
-----   Message from Virginia Crews, MD sent at 03/15/2017 10:58 AM EST ----- Normal labs, including Blood counts, iron panel, kidney function, liver function, electrolytes, cholesterol, Thyroid function, Vit D. I would continue on current Vit D supplement dose, as it seems to be keeping Vit D normal.  A1c is elevated at 6.0.  Still in pre-diabetic range. Recommend low carb diet and exercise. We will continue to monitor annually.  Virginia Crews, MD, MPH [facility] 03/15/2017 10:58 AM

## 2017-04-08 ENCOUNTER — Other Ambulatory Visit: Payer: Self-pay

## 2017-09-09 DIAGNOSIS — H524 Presbyopia: Secondary | ICD-10-CM | POA: Diagnosis not present

## 2017-09-09 DIAGNOSIS — H5213 Myopia, bilateral: Secondary | ICD-10-CM | POA: Diagnosis not present

## 2017-09-09 DIAGNOSIS — H52223 Regular astigmatism, bilateral: Secondary | ICD-10-CM | POA: Diagnosis not present

## 2017-10-18 ENCOUNTER — Encounter: Payer: Self-pay | Admitting: Family Medicine

## 2017-10-18 ENCOUNTER — Ambulatory Visit (INDEPENDENT_AMBULATORY_CARE_PROVIDER_SITE_OTHER): Payer: 59 | Admitting: Family Medicine

## 2017-10-18 DIAGNOSIS — J4520 Mild intermittent asthma, uncomplicated: Secondary | ICD-10-CM | POA: Diagnosis not present

## 2017-10-18 DIAGNOSIS — Z23 Encounter for immunization: Secondary | ICD-10-CM | POA: Diagnosis not present

## 2017-10-18 NOTE — Progress Notes (Signed)
Patient here for vaccinations.  Problem list reviewed as well as vaccine consent.  Patient was not seen.  Vaccines given by CMA.  Virginia Crews, MD, MPH [facility] 10/18/2017 9:02 AM

## 2017-11-11 ENCOUNTER — Encounter: Payer: Self-pay | Admitting: Family Medicine

## 2017-11-11 ENCOUNTER — Ambulatory Visit: Payer: 59 | Admitting: Family Medicine

## 2017-11-11 VITALS — BP 102/60 | HR 65 | Temp 98.3°F | Wt 124.0 lb

## 2017-11-11 DIAGNOSIS — R059 Cough, unspecified: Secondary | ICD-10-CM

## 2017-11-11 DIAGNOSIS — R05 Cough: Secondary | ICD-10-CM

## 2017-11-11 MED ORDER — BENZONATATE 100 MG PO CAPS: 100.0000 mg | ORAL_CAPSULE | Freq: Three times a day (TID) | ORAL | 0 refills | Status: DC | PRN

## 2017-11-11 NOTE — Progress Notes (Signed)
Patient: Tanya Adams Female    DOB: [birth year]   47 y.o.   MRN: [id number] Visit Date: 11/11/2017  Today's Provider: Lavon Paganini, MD   Chief Complaint  Patient presents with  . Asthma  . Cough   Subjective:    Asthma  She complains of cough. There is no shortness of breath or wheezing. The cough is dry (occasionally productive). Pertinent negatives include no ear pain, headaches, postnasal drip, rhinorrhea, sneezing, sore throat or trouble swallowing. Her past medical history is significant for asthma.      Allergies  Allergen Reactions  . Shellfish Allergy Anaphylaxis  . Tape Rash     Current Outpatient Medications:  .  Cholecalciferol (VITAMIN D) 2000 units CAPS, Take by mouth., Disp: , Rfl:  .  fluticasone (FLONASE) 50 MCG/ACT nasal spray, Place into the nose., Disp: , Rfl:  .  loratadine (CLARITIN) 10 MG tablet, Take 10 mg by mouth daily., Disp: , Rfl:  .  montelukast (SINGULAIR) 10 MG tablet, Take 1 tablet (10 mg total) by mouth daily., Disp: 90 tablet, Rfl: 3  Review of Systems  Constitutional: Negative.   HENT: Positive for voice change. Negative for congestion, dental problem, drooling, ear discharge, ear pain, facial swelling, hearing loss, mouth sores, nosebleeds, postnasal drip, rhinorrhea, sinus pressure, sinus pain, sneezing, sore throat, tinnitus and trouble swallowing.   Respiratory: Positive for cough. Negative for apnea, choking, chest tightness, shortness of breath, wheezing and stridor.   Cardiovascular: Negative.   Gastrointestinal: Negative.   Musculoskeletal: Negative.   Neurological: Negative for dizziness, light-headedness and headaches.  Psychiatric/Behavioral: Negative.     Social History   Tobacco Use  . Smoking status: Never Smoker  . Smokeless tobacco: Never Used  Substance Use Topics  . Alcohol use: Yes    Alcohol/week: 0.0 standard drinks    Comment: seldom   Objective:   BP 102/60 (BP Location: Left Arm, Patient  Position: Sitting, Cuff Size: Normal)   Pulse 65   Temp 98.3 F (36.8 C) (Oral)   Wt 124 lb (56.2 kg)   SpO2 99%   BMI 21.28 kg/m  Vitals:   11/11/17 1349  BP: 102/60  Pulse: 65  Temp: 98.3 F (36.8 C)  TempSrc: Oral  SpO2: 99%  Weight: 124 lb (56.2 kg)     Physical Exam  Constitutional: She is oriented to person, place, and time. She appears well-developed and well-nourished. No distress.  HENT:  Head: Normocephalic and atraumatic.  Right Ear: Tympanic membrane, external ear and ear canal normal.  Left Ear: Tympanic membrane, external ear and ear canal normal.  Nose: Nose normal. No mucosal edema.  Mouth/Throat: Oropharynx is clear and moist and mucous membranes are normal. No oropharyngeal exudate.  Eyes: Pupils are equal, round, and reactive to light. Conjunctivae are normal. Right eye exhibits no discharge. Left eye exhibits no discharge. No scleral icterus.  Neck: Neck supple. No thyromegaly present.  Cardiovascular: Normal rate, regular rhythm, normal heart sounds and intact distal pulses.  No murmur heard. Pulmonary/Chest: Effort normal. No respiratory distress. She has wheezes (faint expiratory wheeze). She has no rales.  Musculoskeletal: She exhibits no edema.  Lymphadenopathy:    She has no cervical adenopathy.  Neurological: She is alert and oriented to person, place, and time.  Skin: Skin is warm and dry. Capillary refill takes less than 2 seconds. No rash noted.  Psychiatric: She has a normal mood and affect. Her behavior is normal.  Vitals reviewed.  Assessment & Plan:   1. Cough - suspect related to viral URI -  no evidence of strep pharyngitis, CAP, AOM, bacterial sinusitis, or other bacterial infection - discussed symptomatic management, natural course, and return precautions  - no signs of acute astham exacerbation at this time, but may call next week if wheezing/SOB worsens - will Rx tessalon to use prn    Meds ordered this encounter    Medications  . benzonatate (TESSALON) 100 MG capsule    Sig: Take 1 capsule (100 mg total) by mouth 3 (three) times daily as needed for cough.    Dispense:  90 capsule    Refill:  0     Return if symptoms worsen or fail to improve.   The entirety of the information documented in the History of Present Illness, Review of Systems and Physical Exam were personally obtained by me. Portions of this information were initially documented by Tanya Adams and Tanya Adams, CMA and reviewed by me for thoroughness and accuracy.    Tanya Crews, MD, MPH [facility] 11/11/2017 2:35 PM

## 2018-02-09 ENCOUNTER — Other Ambulatory Visit: Payer: Self-pay | Admitting: Family Medicine

## 2018-02-09 DIAGNOSIS — Z1231 Encounter for screening mammogram for malignant neoplasm of breast: Secondary | ICD-10-CM

## 2018-02-24 ENCOUNTER — Ambulatory Visit (INDEPENDENT_AMBULATORY_CARE_PROVIDER_SITE_OTHER): Payer: 59 | Admitting: Obstetrics and Gynecology

## 2018-02-24 ENCOUNTER — Other Ambulatory Visit (HOSPITAL_COMMUNITY)
Admission: RE | Admit: 2018-02-24 | Discharge: 2018-02-24 | Disposition: A | Discharge status: Home / Self Care | Payer: 59 | Source: Ambulatory Visit | Attending: Obstetrics and Gynecology | Admitting: Obstetrics and Gynecology

## 2018-02-24 ENCOUNTER — Encounter: Payer: Self-pay | Admitting: Obstetrics and Gynecology

## 2018-02-24 VITALS — BP 114/70 | Ht 64.0 in | Wt 125.0 lb

## 2018-02-24 DIAGNOSIS — Z124 Encounter for screening for malignant neoplasm of cervix: Secondary | ICD-10-CM

## 2018-02-24 DIAGNOSIS — Z113 Encounter for screening for infections with a predominantly sexual mode of transmission: Secondary | ICD-10-CM

## 2018-02-24 DIAGNOSIS — Z01419 Encounter for gynecological examination (general) (routine) without abnormal findings: Secondary | ICD-10-CM

## 2018-02-24 DIAGNOSIS — Z1339 Encounter for screening examination for other mental health and behavioral disorders: Secondary | ICD-10-CM

## 2018-02-24 DIAGNOSIS — Z131 Encounter for screening for diabetes mellitus: Secondary | ICD-10-CM | POA: Diagnosis not present

## 2018-02-24 DIAGNOSIS — Z1331 Encounter for screening for depression: Secondary | ICD-10-CM

## 2018-02-24 NOTE — Progress Notes (Signed)
Gynecology Annual Exam  PCP: Virginia Crews, MD   Chief Complaint  Patient presents with  . Annual Exam   History of Present Illness:  [name] is a 48 y.o. [phone] who LMP was No LMP recorded. (Menstrual status: IUD)., presents today for her annual examination.  Her menses are nearly absent with her Mirena IUD.  She does have a vaginal discharge with her IUD, with a clear, non-bothersome discharge.  She does not have vasomotor sx. She uses no meds.  She is sexually active. Reports not issues with intercourse.  Last Pap: 2 years  Results were: no abnormalities /neg HPV DNA negative Hx of STDs: none  Last mammogram: 1 year  Results were: normal--routine follow-up in 12 months.  Mammogram is scheduled for early March.  There is no FH of breast cancer. There is no FH of ovarian cancer. The patient does not do self-breast exams.  Colonoscopy: 2016 - normal DEXA: has not been screened for osteoporosis  Tobacco use: The patient denies current or previous tobacco use. Alcohol use: social drinker Exercise: very active  The patient wears seatbelts: yes.     Past Medical History:  Diagnosis Date  . Allergic rhinitis   . Anemia   . Asthma   . Dysmenorrhea   . History of miscarriage    Past Surgical History:  Procedure Laterality Date  . BREAST CYST ASPIRATION Right   . CESAREAN SECTION    . KNEE ARTHROSCOPY    . NECK EXPLORATION    . PARATHYROIDECTOMY    . TUBAL LIGATION     Prior to Admission medications   Medication Sig Start Date End Date Taking? Authorizing Provider  benzonatate (TESSALON) 100 MG capsule Take 1 capsule (100 mg total) by mouth 3 (three) times daily as needed for cough. 11/11/17  Yes Bacigalupo, Dionne Bucy, MD  Cholecalciferol (VITAMIN D) 2000 units CAPS Take by mouth.   Yes [provider]  fluticasone (FLONASE) 50 MCG/ACT nasal spray Place into the nose. 04/19/14  Yes [provider]  loratadine (CLARITIN) 10 MG tablet Take  10 mg by mouth daily.   Yes [provider]  montelukast (SINGULAIR) 10 MG tablet Take 1 tablet (10 mg total) by mouth daily. 07/07/16  Yes Mar Daring, PA-C    Allergies  Allergen Reactions  . Shellfish Allergy Anaphylaxis  . Tape Rash   Obstetric History: [id number]  Family History  Problem Relation Age of Onset  . Hypertension Mother   . Diabetes Mother   . Depression Mother   . Diabetes Father   . Benign prostatic hyperplasia Father   . Heart disease Father   . Healthy Brother   . Breast cancer Neg Hx   . Colon cancer Neg Hx   . Ovarian cancer Neg Hx     Social History   Socioeconomic History  . Marital status: Married    Spouse name: Not on file  . Number of children: 2  . Years of education: Not on file  . Highest education level: Doctorate  Occupational History  . Occupation: PHYSICIAN    Employer: [employer]  Social Needs  . Financial resource strain: Not on file  . Food insecurity:    Worry: Not on file    Inability: Not on file  . Transportation needs:    Medical: Not on file    Non-medical: Not on file  Tobacco Use  . Smoking status: Never Smoker  . Smokeless tobacco: Never Used  Substance and Sexual Activity  . Alcohol use: Yes    Alcohol/week: 0.0 standard drinks    Comment: seldom  . Drug use: No  . Sexual activity: Yes    Birth control/protection: Surgical, I.U.D.  Lifestyle  . Physical activity:    Days per week: 6 days    Minutes per session: 80 min  . Stress: Not on file  Relationships  . Social connections:    Talks on phone: Not on file    Gets together: Not on file    Attends religious service: Not on file    Active member of club or organization: Not on file    Attends meetings of clubs or organizations: Not on file    Relationship status: Not on file  . Intimate partner violence:    Fear of current or ex partner: Not on file    Emotionally abused: Not on file    Physically abused: Not on file     Forced sexual activity: Not on file  Other Topics Concern  . Not on file  Social History Narrative  . Not on file    Review of Systems  Constitutional: Negative.   HENT: Negative.   Eyes: Negative.   Respiratory: Negative.   Cardiovascular: Negative.   Gastrointestinal: Negative.   Genitourinary: Negative.   Musculoskeletal: Negative.   Skin: Negative.   Neurological: Negative.   Psychiatric/Behavioral: Negative.      Physical Exam BP 114/70   Ht 5\' 4"  (1.626 m)   Wt 125 lb (56.7 kg)   BMI 21.46 kg/m   Physical Exam Constitutional:      General: She is not in acute distress.    Appearance: Normal appearance. She is well-developed.  Genitourinary:     Pelvic exam was performed with patient supine.     Vulva, inguinal canal, urethra, bladder, uterus, right adnexa and left adnexa normal.     No posterior fourchette tenderness, injury or lesion present.     No inguinal adenopathy present in the right or left side.    No signs of injury in the vagina.     No vaginal discharge, erythema, tenderness or bleeding.     No cervical motion tenderness, discharge, lesion, bleeding or polyp.     No IUD strings visualized.     Uterus is mobile.     Uterus is not enlarged or tender.     No uterine mass detected.    Uterus is anteverted.     No right or left adnexal mass present.     Right adnexa not full.     Left adnexa not full.  HENT:     Head: Normocephalic and atraumatic.  Eyes:     General: No scleral icterus.    Conjunctiva/sclera: Conjunctivae normal.  Neck:     Musculoskeletal: Normal range of motion and neck supple.     Thyroid: No thyromegaly.  Cardiovascular:     Rate and Rhythm: Normal rate and regular rhythm.     Heart sounds: No murmur. No friction rub. No gallop.   Pulmonary:     Effort: Pulmonary effort is normal. No respiratory distress.     Breath sounds: Normal breath sounds. No wheezing or rales.  Chest:     Breasts:        Right: No inverted  nipple, mass, nipple discharge, skin change or tenderness.        Left: No inverted nipple, mass, nipple discharge, skin change or tenderness.  Abdominal:  General: Bowel sounds are normal. There is no distension.     Palpations: Abdomen is soft. There is no mass.     Tenderness: There is no abdominal tenderness. There is no guarding or rebound.  Musculoskeletal: Normal range of motion.        General: No tenderness.  Lymphadenopathy:     Cervical: No cervical adenopathy.     Lower Body: No right inguinal adenopathy. No left inguinal adenopathy.  Neurological:     General: No focal deficit present.     Mental Status: She is alert and oriented to person, place, and time.     Cranial Nerves: No cranial nerve deficit.  Skin:    General: Skin is warm and dry.     Findings: No erythema or rash.  Psychiatric:        Mood and Affect: Mood normal.        Behavior: Behavior normal.        Judgment: Judgment normal.   Female chaperone present for pelvic and breast  portions of the physical exam  Results: AUDIT Questionnaire (screen for alcoholism): 1 PHQ-9: 0  Assessment: 48 y.o. [id number] female here for routine gynecologic examination.  Plan: Problem List Items Addressed This Visit    None    Visit Diagnoses    Women's annual routine gynecological examination    -  Primary   Relevant Orders   Hgb A1c w/o eAG   Cytology - PAP   Screening for depression       Screening for alcoholism       Pap smear for cervical cancer screening       Relevant Orders   Cytology - PAP   Screening for diabetes mellitus       Relevant Orders   Hgb A1c w/o eAG   Screen for STD (sexually transmitted disease)       Relevant Orders   Cytology - PAP     Screening: -- Blood pressure screen normal -- Colonoscopy - not due -- Mammogram - due - already scheduled at [facility] on March 2020 -- Weight screening: normal -- Depression screening negative (PHQ-9) -- Nutrition: normal -- cholesterol  screening: not due for screening -- osteoporosis screening: not due -- tobacco screening: not using -- alcohol screening: AUDIT questionnaire indicates low-risk usage. -- family history of breast cancer screening: done. not at high risk. -- no evidence of domestic violence or intimate partner violence. -- STD screening: gonorrhea/chlamydia NAAT collected -- pap smear collected per ASCCP guidelines -- flu vaccine received -- HPV vaccination series: not eligilbe   Strong family history of diabetes.  Last hemoglobin A1c 6.0.  Patient requests recheck. Performed today.  Discussed non-visualization of IUD strings. She states that she feels comfortable that it is in place since her periods are absent.  Will check with ultrasound, if there is a question.   Prentice Docker, MD 02/24/2018 2:42 PM

## 2018-02-25 LAB — HGB A1C W/O EAG: Hgb A1c MFr Bld: 6.2 % — ABNORMAL HIGH (ref 4.8–5.6)

## 2018-02-28 ENCOUNTER — Encounter: Payer: Self-pay | Admitting: Family Medicine

## 2018-02-28 ENCOUNTER — Telehealth: Payer: Self-pay | Admitting: Family Medicine

## 2018-02-28 DIAGNOSIS — Z13220 Encounter for screening for lipoid disorders: Secondary | ICD-10-CM

## 2018-02-28 DIAGNOSIS — R7303 Prediabetes: Secondary | ICD-10-CM

## 2018-02-28 DIAGNOSIS — R739 Hyperglycemia, unspecified: Secondary | ICD-10-CM

## 2018-02-28 NOTE — Telephone Encounter (Signed)
I'm happy for it to just be a follow-up on her A1c and such if she wants.  I saw her physical from [facility] and seems that everything was covered well.

## 2018-02-28 NOTE — Telephone Encounter (Signed)
Left detailed message on patient voice message system advising patient as below. I asked patient to call back to let us know if she she wanted to just do a follow up, so that we could change the appointment type to "follow up" instead of "CPE".

## 2018-02-28 NOTE — Telephone Encounter (Signed)
Patient would like to know if you would still like her to come in for a physical here since she had one at [facility]?  She states that she can come in for the physical or you can change it to a follow up if you prefer.  She states that you can leave a message on her cell or send her a message.  Please advise

## 2018-03-01 LAB — CYTOLOGY - PAP
Chlamydia: NEGATIVE
DIAGNOSIS: NEGATIVE
HPV: NOT DETECTED
Neisseria Gonorrhea: NEGATIVE

## 2018-03-02 DIAGNOSIS — Z13220 Encounter for screening for lipoid disorders: Secondary | ICD-10-CM | POA: Diagnosis not present

## 2018-03-02 DIAGNOSIS — R739 Hyperglycemia, unspecified: Secondary | ICD-10-CM | POA: Diagnosis not present

## 2018-03-02 DIAGNOSIS — R7303 Prediabetes: Secondary | ICD-10-CM | POA: Diagnosis not present

## 2018-03-09 LAB — LIPID PANEL
CHOLESTEROL TOTAL: 158 mg/dL (ref 100–199)
Chol/HDL Ratio: 3.2 ratio (ref 0.0–4.4)
HDL: 50 mg/dL (ref >39)
LDL CALC: 101 mg/dL — AB (ref 0–99)
Triglycerides: 35 mg/dL (ref 0–149)
VLDL CHOLESTEROL CAL: 7 mg/dL (ref 5–40)

## 2018-03-09 LAB — COMPREHENSIVE METABOLIC PANEL
ALBUMIN: 4.3 g/dL (ref 3.8–4.8)
ALK PHOS: 36 IU/L — AB (ref 39–117)
ALT: 27 IU/L (ref 0–32)
AST: 28 IU/L (ref 0–40)
Albumin/Globulin Ratio: 2 (ref 1.2–2.2)
BUN/Creatinine Ratio: 20 (ref 9–23)
BUN: 13 mg/dL (ref 6–24)
Bilirubin Total: 0.9 mg/dL (ref 0.0–1.2)
CALCIUM: 9 mg/dL (ref 8.7–10.2)
CO2: 23 mmol/L (ref 20–29)
CREATININE: 0.65 mg/dL (ref 0.57–1.00)
Chloride: 103 mmol/L (ref 96–106)
GFR, EST AFRICAN AMERICAN: 123 mL/min/{1.73_m2} (ref >59)
GFR, EST NON AFRICAN AMERICAN: 107 mL/min/{1.73_m2} (ref >59)
GLUCOSE: 93 mg/dL (ref 65–99)
Globulin, Total: 2.1 g/dL (ref 1.5–4.5)
Potassium: 3.9 mmol/L (ref 3.5–5.2)
Sodium: 141 mmol/L (ref 134–144)
TOTAL PROTEIN: 6.4 g/dL (ref 6.0–8.5)

## 2018-03-09 LAB — ANTI-ISLET CELL ANTIBODY: Islet Cell Ab: NEGATIVE

## 2018-03-09 LAB — GLUTAMIC ACID DECARBOXYLASE AUTO ABS: Glutamic Acid Decarb Ab: 58.1 U/mL — ABNORMAL HIGH (ref 0.0–5.0)

## 2018-03-09 LAB — INSULIN AND C-PEPTIDE, SERUM
C-Peptide: 1 ng/mL — ABNORMAL LOW (ref 1.1–4.4)
INSULIN: 3.1 u[IU]/mL (ref 2.6–24.9)

## 2018-03-10 ENCOUNTER — Encounter: Payer: Self-pay | Admitting: Family Medicine

## 2018-03-10 ENCOUNTER — Other Ambulatory Visit: Payer: Self-pay

## 2018-03-10 ENCOUNTER — Ambulatory Visit (INDEPENDENT_AMBULATORY_CARE_PROVIDER_SITE_OTHER): Payer: 59 | Admitting: Family Medicine

## 2018-03-10 VITALS — BP 106/69 | HR 58 | Temp 97.9°F | Wt 125.0 lb

## 2018-03-10 DIAGNOSIS — J4520 Mild intermittent asthma, uncomplicated: Secondary | ICD-10-CM | POA: Diagnosis not present

## 2018-03-10 DIAGNOSIS — R7303 Prediabetes: Secondary | ICD-10-CM | POA: Diagnosis not present

## 2018-03-10 DIAGNOSIS — R768 Other specified abnormal immunological findings in serum: Secondary | ICD-10-CM | POA: Diagnosis not present

## 2018-03-10 MED ORDER — PEAK FLOW METER DEVI: 0 refills | Status: AC

## 2018-03-10 MED ORDER — MELOXICAM 15 MG PO TABS: 15.0000 mg | ORAL_TABLET | Freq: Every day | ORAL | 0 refills | Status: DC

## 2018-03-10 MED ORDER — FREESTYLE LIBRE SENSOR SYSTEM MISC: 0 refills | Status: DC

## 2018-03-10 MED ORDER — METFORMIN HCL 500 MG PO TABS: 500.0000 mg | ORAL_TABLET | Freq: Two times a day (BID) | ORAL | 3 refills | Status: DC

## 2018-03-10 NOTE — Progress Notes (Signed)
Patient: Tanya Adams Female    DOB: [birth year]   48 y.o.   MRN: [id number] Visit Date: 03/10/2018  Today's Provider: Lavon Paganini, MD   Chief Complaint  Patient presents with  . Follow-up   Subjective:     HPI   Prediabetes, Follow-up:   Lab Results  Component Value Date   HGBA1C 6.2 (H) 02/24/2018   HGBA1C 6.0 (H) 03/12/2017   GLUCOSE 93 03/02/2018   GLUCOSE 89 03/12/2017    Last seen for for this2 weeks ago.  Management since that visit includes None. Current symptoms include none and have been stable.  Weight trend: stable Prior visit with dietician: no Current diet: in general, a "healthy" diet  pt has cut out a lot of carbs Current exercise: running/ jogging and yoga  Pertinent Labs:    Component Value Date/Time   CHOL 158 03/02/2018 0819   TRIG 35 03/02/2018 0819   CHOLHDL 3.2 03/02/2018 0819   CREATININE 0.65 03/02/2018 0819    Wt Readings from Last 3 Encounters:  03/10/18 125 lb (56.7 kg)  02/24/18 125 lb (56.7 kg)  11/11/17 124 lb (56.2 kg)   A1c has been increasing over the last few years and is now elevated at 6.2 as of 02/24/2018.  She states both her parents have diet controlled diabetes.  She exercises regularly and runs marathons.  She tried a low-carb diet but found that she was too exhausted to run on a very low carb limit.  She is added back complex carbohydrates and fruits.  There was some concern regarding possibility of LADA given that she is thin and otherwise healthy yet her A1c continues to increase.  Labs on 03/02/2018 showed stable cholesterol, normal CMP, normal fasting insulin levels, slightly decreased C-peptide, negative islet cell antibodies, and significantly elevated glutamic acid antibodies  Allergies  Allergen Reactions  . Shellfish Allergy Anaphylaxis  . Tape Rash     Current Outpatient Medications:  .  Cholecalciferol (VITAMIN D) 2000 units CAPS, Take by mouth., Disp: , Rfl:  .  fluticasone (FLONASE) 50  MCG/ACT nasal spray, Place into the nose., Disp: , Rfl:  .  loratadine (CLARITIN) 10 MG tablet, Take 10 mg by mouth daily., Disp: , Rfl:  .  montelukast (SINGULAIR) 10 MG tablet, Take 1 tablet (10 mg total) by mouth daily., Disp: 90 tablet, Rfl: 3  Review of Systems  Constitutional: Negative.   HENT: Negative.   Respiratory: Negative.   Cardiovascular: Negative.   Endocrine: Negative.   Genitourinary: Negative.   Musculoskeletal: Negative.   Neurological: Negative.   Psychiatric/Behavioral: Negative.     Social History   Tobacco Use  . Smoking status: Never Smoker  . Smokeless tobacco: Never Used  Substance Use Topics  . Alcohol use: Yes    Alcohol/week: 0.0 standard drinks    Comment: seldom      Objective:   BP 106/69 (BP Location: Left Arm, Patient Position: Sitting, Cuff Size: Normal)   Pulse (!) 58   Temp 97.9 F (36.6 C) (Oral)   Wt 125 lb (56.7 kg)   SpO2 99%   BMI 21.46 kg/m  Vitals:   03/10/18 1403  BP: 106/69  Pulse: (!) 58  Temp: 97.9 F (36.6 C)  TempSrc: Oral  SpO2: 99%  Weight: 125 lb (56.7 kg)     Physical Exam Vitals signs reviewed.  Constitutional:      General: She is not in acute distress.    Appearance: She is  well-developed.  HENT:     Head: Normocephalic and atraumatic.  Eyes:     General: No scleral icterus.    Conjunctiva/sclera: Conjunctivae normal.  Cardiovascular:     Rate and Rhythm: Normal rate and regular rhythm.     Pulses: Normal pulses.     Heart sounds: Normal heart sounds. No murmur.  Pulmonary:     Effort: Pulmonary effort is normal. No respiratory distress.     Breath sounds: Normal breath sounds. No wheezing or rhonchi.  Musculoskeletal:     Right lower leg: No edema.     Left lower leg: No edema.  Skin:    General: Skin is warm and dry.     Capillary Refill: Capillary refill takes less than 2 seconds.     Findings: No rash.  Neurological:     Mental Status: She is alert and oriented to person, place, and  time. Mental status is at baseline.  Psychiatric:        Mood and Affect: Mood normal.        Behavior: Behavior normal.         Assessment & Plan   Problem List Items Addressed This Visit      Respiratory   Asthma with allergic rhinitis    Patient is well controlled and able to run marathons without any issue She would like a peak flow meter to monitor this as part of her requirement for her insurance      Relevant Medications   Peak Flow Meter DEVI     Other   Prediabetes - Primary    Worsening A1c elevated at 6.2 at last check Normal fasting insulin level suggests that she does not have insulin resistance She has a fairly healthy diet and exercises regularly She is unable to tolerate a lower carb diet due to exercise intolerance We discussed options and decided to go ahead with metformin 500 mg twice daily and see if this can slow progression We will also try to obtain Freestyle libre sensor for continuous blood glucose monitoring so that she is able to find a trend between what foods seem to raise her blood sugar more and what seems to lower it      Relevant Medications   Continuous Blood Gluc Sensor ([place]) MISC   Autoantibody titer elevated    Elevated glutamic acid antibodies and normal fasting insulin levels suggest that patient does not have insulin resistance and is likely at risk for progression to diabetes related to LADA Some studies have been showing some slowed progression with Januvia, so we will read more about this      Relevant Medications   Continuous Blood Gluc Sensor ([place]) MISC       Return in about 6 months (around 09/08/2018) for prediabetes f/u (with labs 2 wks early).   The entirety of the information documented in the History of Present Illness, Review of Systems and Physical Exam were personally obtained by me. Portions of this information were initially documented by Tiburcio Pea, CMA and  reviewed by me for thoroughness and accuracy.    Virginia Crews, MD, MPH [facility] 03/11/2018 1:12 PM

## 2018-03-11 NOTE — Assessment & Plan Note (Signed)
Elevated glutamic acid antibodies and normal fasting insulin levels suggest that patient does not have insulin resistance and is likely at risk for progression to diabetes related to LADA Some studies have been showing some slowed progression with Januvia, so we will read more about this

## 2018-03-11 NOTE — Assessment & Plan Note (Signed)
Worsening A1c elevated at 6.2 at last check Normal fasting insulin level suggests that she does not have insulin resistance She has a fairly healthy diet and exercises regularly She is unable to tolerate a lower carb diet due to exercise intolerance We discussed options and decided to go ahead with metformin 500 mg twice daily and see if this can slow progression We will also try to obtain Freestyle libre sensor for continuous blood glucose monitoring so that she is able to find a trend between what foods seem to raise her blood sugar more and what seems to lower it

## 2018-03-11 NOTE — Assessment & Plan Note (Signed)
Patient is well controlled and able to run marathons without any issue She would like a peak flow meter to monitor this as part of her requirement for her insurance

## 2018-03-15 ENCOUNTER — Ambulatory Visit
Admission: RE | Admit: 2018-03-15 | Discharge: 2018-03-15 | Disposition: A | Discharge status: Home / Self Care | Payer: 59 | Source: Ambulatory Visit | Attending: Family Medicine | Admitting: Family Medicine

## 2018-03-15 DIAGNOSIS — Z1231 Encounter for screening mammogram for malignant neoplasm of breast: Secondary | ICD-10-CM | POA: Diagnosis not present

## 2018-03-16 ENCOUNTER — Telehealth: Payer: Self-pay

## 2018-03-16 NOTE — Telephone Encounter (Signed)
-----   Message from Virginia Crews, MD sent at 03/16/2018 11:57 AM EST ----- Normal mammogram. Repeat in 1 yr

## 2018-03-16 NOTE — Telephone Encounter (Signed)
LVMTRC 

## 2018-03-17 ENCOUNTER — Telehealth: Payer: Self-pay | Admitting: Family Medicine

## 2018-03-17 DIAGNOSIS — R768 Other specified abnormal immunological findings in serum: Secondary | ICD-10-CM

## 2018-03-17 DIAGNOSIS — R7303 Prediabetes: Secondary | ICD-10-CM

## 2018-03-17 MED ORDER — FREESTYLE LIBRE 14 DAY SENSOR MISC: 1.0000 | 3 refills | Status: DC

## 2018-03-17 NOTE — Telephone Encounter (Signed)
3 month supply sent with refills

## 2018-03-17 NOTE — Telephone Encounter (Signed)
Pt stated that she picked up Continuous Blood Gluc Sensor ([employer]) MISC from [place] but it was only enough for 14 days. Pt stated it was supposed to be for 30 days and request a new Rx so she can get enough for a month. Please advise. Thanks TNP

## 2018-03-18 ENCOUNTER — Encounter: Payer: Self-pay | Admitting: Family Medicine

## 2018-03-18 DIAGNOSIS — R7303 Prediabetes: Secondary | ICD-10-CM

## 2018-03-18 DIAGNOSIS — R768 Other specified abnormal immunological findings in serum: Secondary | ICD-10-CM

## 2018-03-18 MED ORDER — SEMAGLUTIDE 3 MG PO TABS: 3.0000 mg | ORAL_TABLET | Freq: Every day | ORAL | 5 refills | Status: DC

## 2018-03-21 ENCOUNTER — Encounter: Payer: Self-pay | Admitting: Family Medicine

## 2018-04-05 IMAGING — MG MM DIGITAL SCREENING BILAT W/ TOMO W/ CAD
9 of 12 series · 9 of 28 positions shown · non-contrast
Comparison: Previous exam(s).

CLINICAL DATA: Screening.

EXAM:
2D DIGITAL SCREENING BILATERAL MAMMOGRAM WITH CAD AND ADJUNCT TOMO

[R CC]
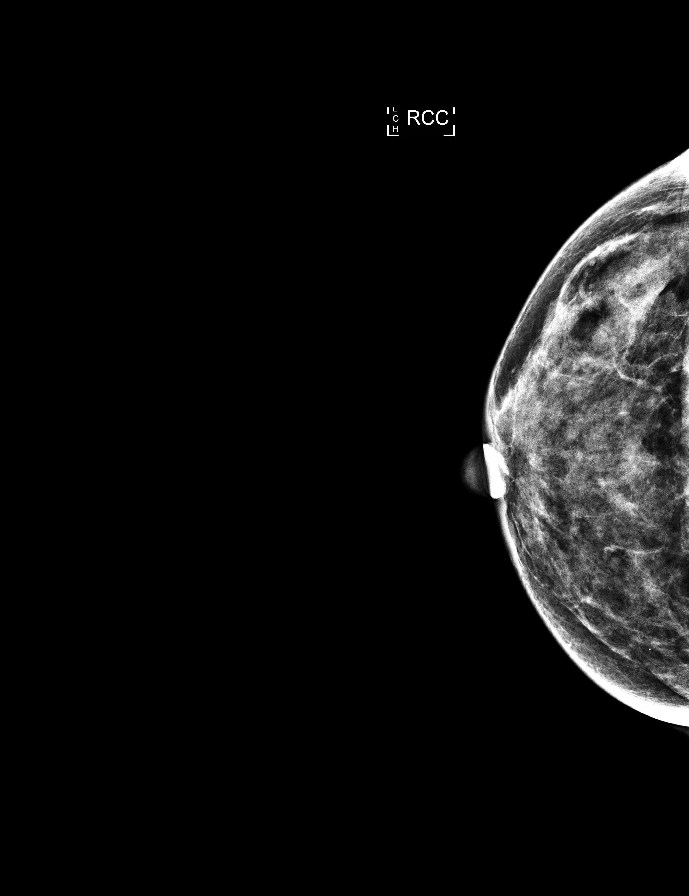

[L CC]
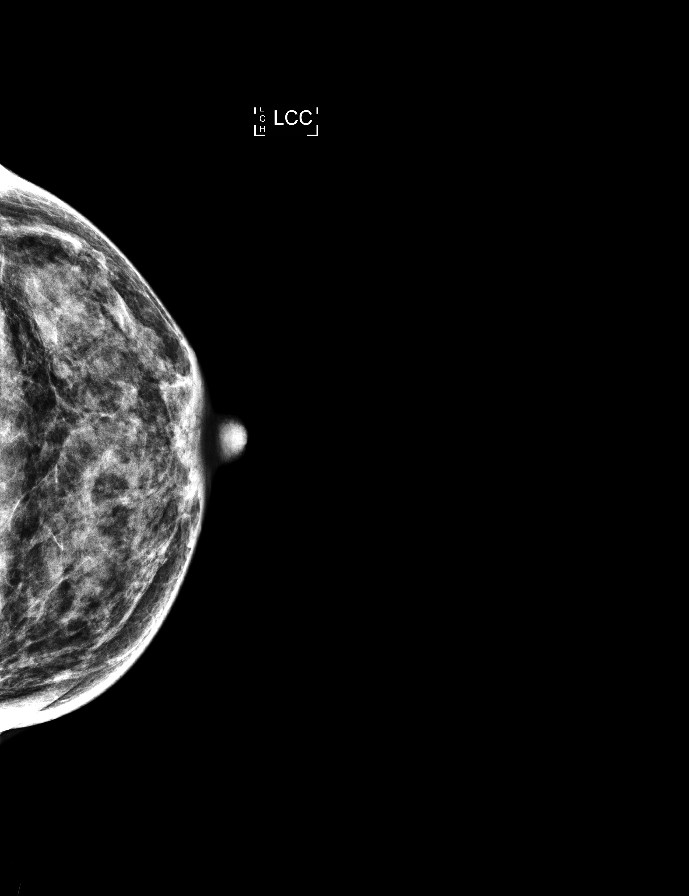

[R MLO synth-2D]
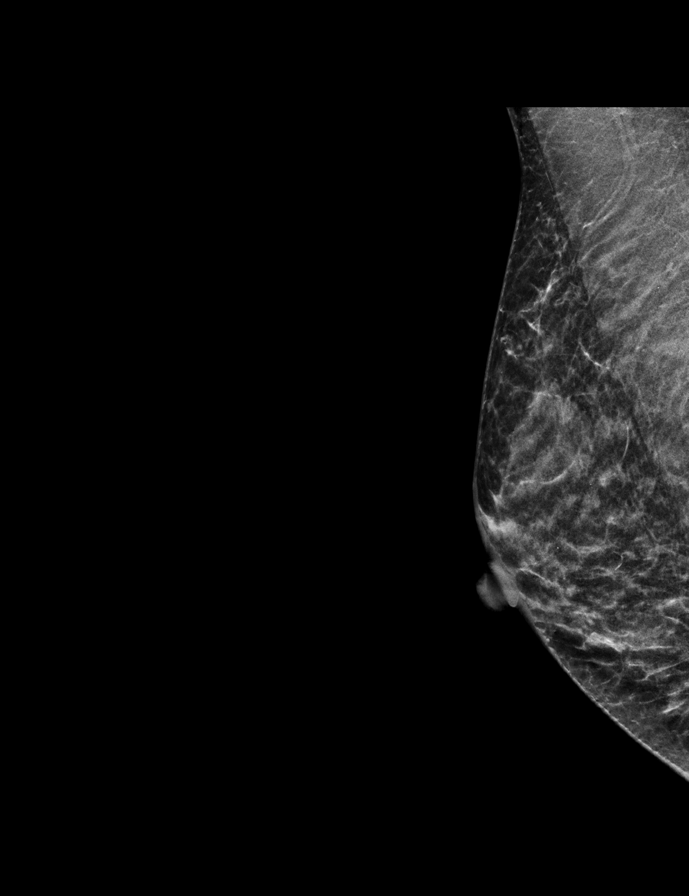

[R MLO]
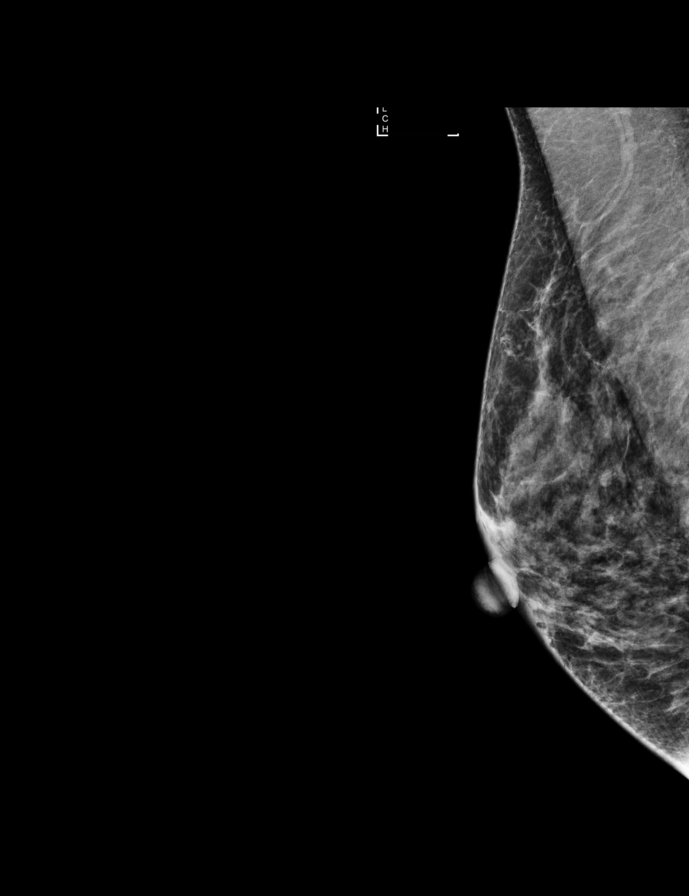

[R CC synth-2D]
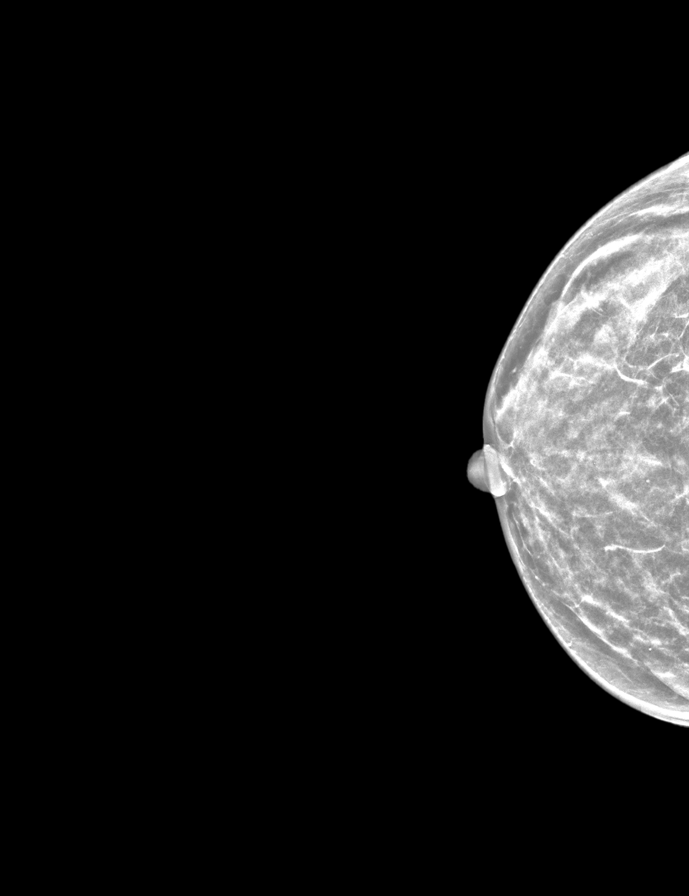

[L MLO synth-2D]
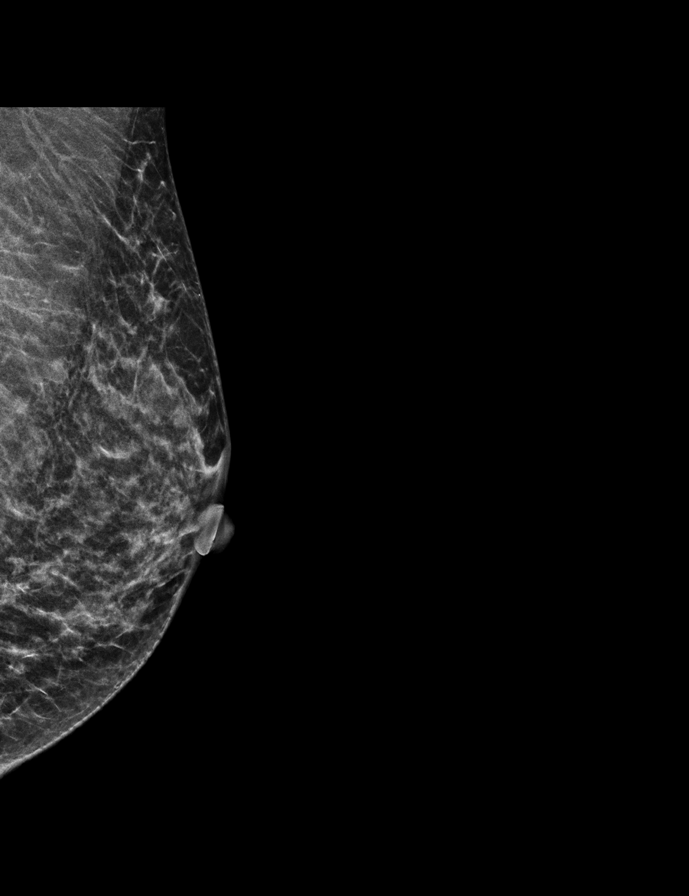

[L MLO]
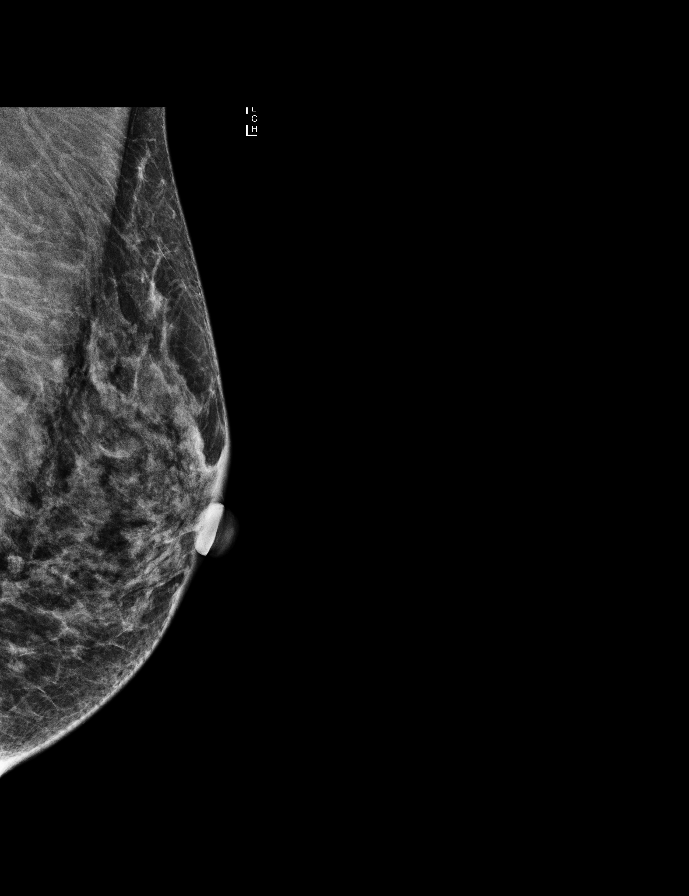

[L CC synth-2D]
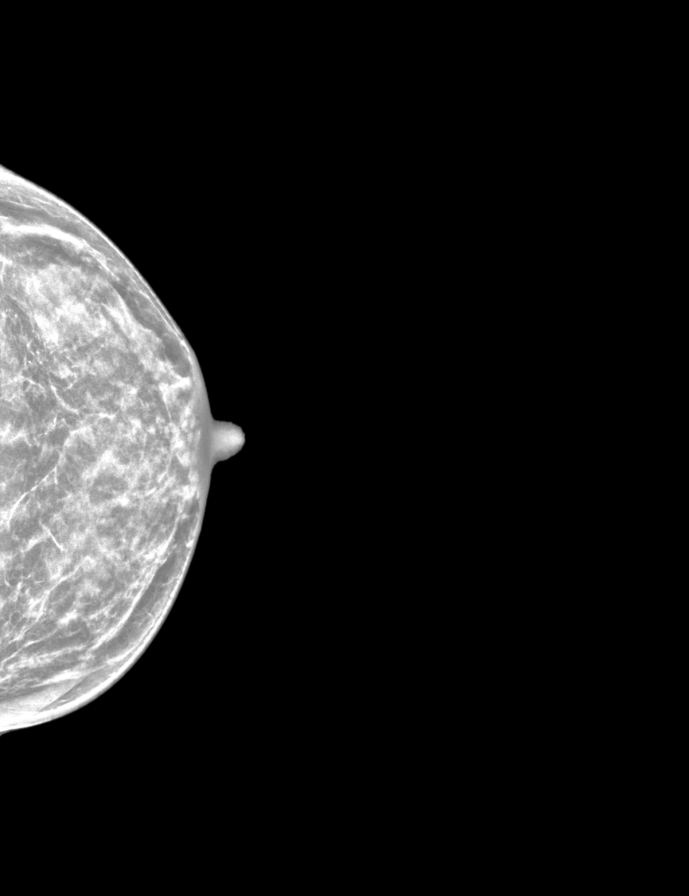

[L CC tomo · tomo slice 21/40.0]
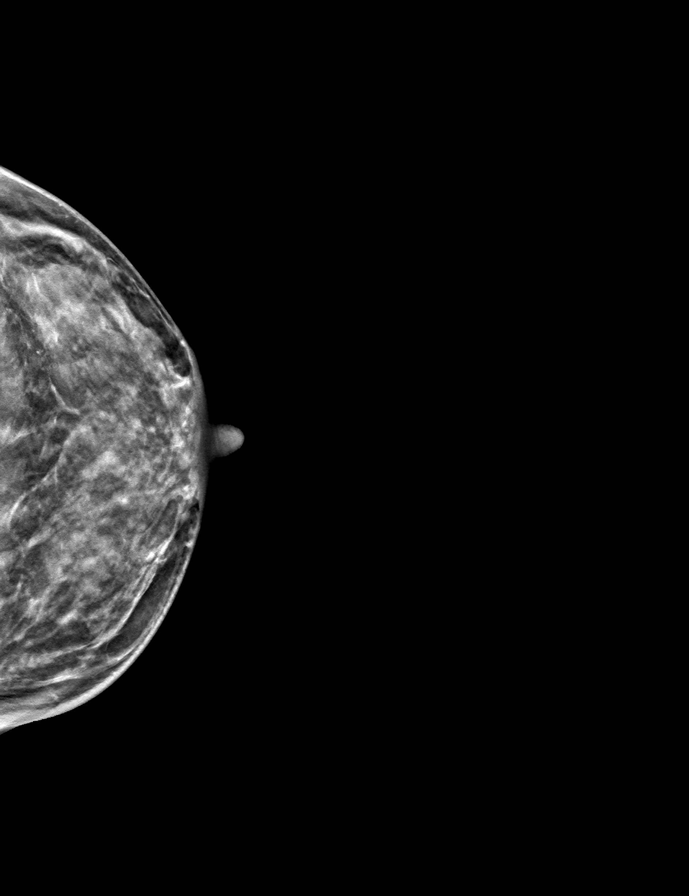

[9 of 28 positions shown; findings below may reference images not displayed]

ACR Breast Density Category c: The breast tissue is heterogeneously
dense, which may obscure small masses.
FINDINGS: There are no findings suspicious for malignancy. Images were
processed with CAD.
IMPRESSION: No mammographic evidence of malignancy. A result letter of this
screening mammogram will be mailed directly to the patient.

RECOMMENDATION:
Screening mammogram in one year. (Code:TN-[id number])

BI-RADS CATEGORY  1: Negative.

## 2018-04-08 ENCOUNTER — Ambulatory Visit (INDEPENDENT_AMBULATORY_CARE_PROVIDER_SITE_OTHER): Payer: 59 | Admitting: Podiatry

## 2018-04-08 ENCOUNTER — Other Ambulatory Visit: Payer: Self-pay

## 2018-04-08 ENCOUNTER — Ambulatory Visit (INDEPENDENT_AMBULATORY_CARE_PROVIDER_SITE_OTHER): Payer: 59

## 2018-04-08 ENCOUNTER — Other Ambulatory Visit: Payer: Self-pay | Admitting: Podiatry

## 2018-04-08 ENCOUNTER — Encounter: Payer: Self-pay | Admitting: Podiatry

## 2018-04-08 DIAGNOSIS — S99921A Unspecified injury of right foot, initial encounter: Secondary | ICD-10-CM

## 2018-04-08 DIAGNOSIS — M79671 Pain in right foot: Secondary | ICD-10-CM

## 2018-04-08 DIAGNOSIS — M79674 Pain in right toe(s): Secondary | ICD-10-CM

## 2018-04-08 DIAGNOSIS — M7751 Other enthesopathy of right foot: Secondary | ICD-10-CM

## 2018-04-08 NOTE — Progress Notes (Signed)
   HPI: Patient presents today with a new complaint regarding pain and tenderness to the right forefoot.  Patient states that she stumped her toe on the corner of a bed approximately 3 months prior to visit today.  She is experienced pain and tenderness around the toe ever since the injury.  The patient is an avid runner running 20-25 miles per week. Wearing her Rolena Infante or [employer] running shoes does help to alleviate the patient's symptoms.  She presents today for evaluation mainly to see if she is not sustained a fracture to the area.   Past Medical History:  Diagnosis Date  . Allergic rhinitis   . Anemia   . Asthma   . Dysmenorrhea   . History of miscarriage      Physical Exam: General: The patient is alert and oriented x3 in no acute distress.  Dermatology: Skin is warm, dry and supple bilateral lower extremities. Negative for open lesions or macerations.  Vascular: Palpable pedal pulses bilaterally. No edema or erythema noted. Capillary refill within normal limits.  Neurological: Epicritic and protective threshold grossly intact bilaterally.   Musculoskeletal Exam: Pain on palpation and with range of motion to the second MTPJ right foot.  Pain is most severe with plantar flexion of the joint.  Range of motion within normal limits to all pedal and ankle joints bilateral. Muscle strength 5/5 in all groups bilateral.   Radiographic Exam:  Normal osseous mineralization. Joint spaces preserved. No fracture/dislocation/boney destruction.  No evidence of fracture to the second ray of the right foot.  Assessment: -Second MTPJ capsulitis right foot   Plan of Care:  - Patient evaluated. X-rays reviewed. -Continue wearing good supportive shoe gear.  Patient does have a pair of custom molded orthotics that she does wear on occasion -Additional treatment options were discussed however she would like to give the foot some time to rest and heal on its own. -Return to clinic as needed  Edrick Kins, DPM [facility]  Dr. Edrick Kins, DPM    [address], [place] [address]                Office [phone]  Fax [phone]

## 2018-04-17 ENCOUNTER — Encounter: Payer: Self-pay | Admitting: Family Medicine

## 2018-04-17 DIAGNOSIS — J4540 Moderate persistent asthma, uncomplicated: Secondary | ICD-10-CM

## 2018-04-18 MED ORDER — MONTELUKAST SODIUM 10 MG PO TABS: 10.0000 mg | ORAL_TABLET | Freq: Every day | ORAL | 3 refills | Status: DC

## 2018-05-10 ENCOUNTER — Encounter: Payer: Self-pay | Admitting: Family Medicine

## 2018-05-10 DIAGNOSIS — R768 Other specified abnormal immunological findings in serum: Secondary | ICD-10-CM

## 2018-05-10 DIAGNOSIS — R7303 Prediabetes: Secondary | ICD-10-CM

## 2018-05-11 MED ORDER — GLUCOSE BLOOD VI STRP: ORAL_STRIP | 12 refills | Status: DC

## 2018-05-17 ENCOUNTER — Encounter: Payer: Self-pay | Admitting: Family Medicine

## 2018-05-17 DIAGNOSIS — R7303 Prediabetes: Secondary | ICD-10-CM

## 2018-05-17 DIAGNOSIS — Z9009 Acquired absence of other part of head and neck: Secondary | ICD-10-CM

## 2018-05-17 DIAGNOSIS — R768 Other specified abnormal immunological findings in serum: Secondary | ICD-10-CM

## 2018-05-17 DIAGNOSIS — E559 Vitamin D deficiency, unspecified: Secondary | ICD-10-CM

## 2018-05-17 DIAGNOSIS — E892 Postprocedural hypoparathyroidism: Secondary | ICD-10-CM

## 2018-05-25 ENCOUNTER — Encounter: Payer: Self-pay | Admitting: Family Medicine

## 2018-05-25 ENCOUNTER — Other Ambulatory Visit: Payer: Self-pay

## 2018-05-25 DIAGNOSIS — R7303 Prediabetes: Secondary | ICD-10-CM | POA: Diagnosis not present

## 2018-05-25 DIAGNOSIS — R768 Other specified abnormal immunological findings in serum: Secondary | ICD-10-CM | POA: Diagnosis not present

## 2018-05-25 DIAGNOSIS — E559 Vitamin D deficiency, unspecified: Secondary | ICD-10-CM | POA: Diagnosis not present

## 2018-05-25 DIAGNOSIS — Z9009 Acquired absence of other part of head and neck: Secondary | ICD-10-CM | POA: Diagnosis not present

## 2018-05-26 ENCOUNTER — Encounter: Payer: Self-pay | Admitting: Family Medicine

## 2018-05-26 LAB — VITAMIN B12: Vitamin B-12: 588 pg/mL (ref 232–1245)

## 2018-05-26 LAB — HEMOGLOBIN A1C
Est. average glucose Bld gHb Est-mCnc: 114 mg/dL
Hgb A1c MFr Bld: 5.6 % (ref 4.8–5.6)

## 2018-05-26 LAB — VITAMIN D 25 HYDROXY (VIT D DEFICIENCY, FRACTURES): Vit D, 25-Hydroxy: 46.7 ng/mL (ref 30.0–100.0)

## 2018-05-26 LAB — C-PEPTIDE: C-Peptide: 1 ng/mL — ABNORMAL LOW (ref 1.1–4.4)

## 2018-05-26 LAB — GLUTAMIC ACID DECARBOXYLASE AUTO ABS: Glutamic Acid Decarb Ab: 10 U/mL — ABNORMAL HIGH (ref 0.0–5.0)

## 2018-07-07 ENCOUNTER — Encounter: Payer: Self-pay | Admitting: General Surgery

## 2018-07-07 ENCOUNTER — Ambulatory Visit (INDEPENDENT_AMBULATORY_CARE_PROVIDER_SITE_OTHER): Payer: 59 | Admitting: General Surgery

## 2018-07-07 ENCOUNTER — Other Ambulatory Visit: Payer: Self-pay

## 2018-07-07 ENCOUNTER — Ambulatory Visit (INDEPENDENT_AMBULATORY_CARE_PROVIDER_SITE_OTHER): Payer: 59

## 2018-07-07 VITALS — BP 122/74 | HR 54 | Temp 97.7°F | Resp 14 | Ht 64.0 in | Wt 118.4 lb

## 2018-07-07 DIAGNOSIS — N6321 Unspecified lump in the left breast, upper outer quadrant: Secondary | ICD-10-CM | POA: Diagnosis not present

## 2018-07-07 NOTE — Progress Notes (Signed)
Patient ID: Tanya Adams, female   DOB: 09/13/1970, 48 y.o.   MRN: [id number]  Chief Complaint  Patient presents with  . New Patient (Initial Visit)    breast mass    HPI Tanya Adams is a 48 y.o. 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 Her last mammogram was on 03/15/18. She reports that she first noticed this at the end of May. It is in her left axillary area. She gets annual mammograms done. She has recently lost about 10 pounds after being diagnosed with diabetes thought secondary to auto antibodies.  The patient noticed this incidentally during 1 of her infrequent breast self-examinations.  There is not been a change in size since initial discovery, nor has there been any tenderness.  She did have a cyst on her right breast that was aspirated as a teenager.  HPI  Past Medical History:  Diagnosis Date  . Allergic rhinitis   . Anemia   . Asthma   . Dysmenorrhea   . History of miscarriage   . Vitamin D deficiency     Past Surgical History:  Procedure Laterality Date  . BREAST CYST ASPIRATION Right   . CESAREAN SECTION    . KNEE ARTHROSCOPY    . NECK EXPLORATION    . PARATHYROIDECTOMY    . TUBAL LIGATION      Family History  Problem Relation Age of Onset  . Hypertension Mother   . Diabetes Mother   . Depression Mother   . Diabetes Father   . Benign prostatic hyperplasia Father   . Heart disease Father   . Healthy Brother   . Breast cancer Neg Hx   . Colon cancer Neg Hx   . Ovarian cancer Neg Hx     Social History Social History   Tobacco Use  . Smoking status: Never Smoker  . Smokeless tobacco: Never Used  Substance Use Topics  . Alcohol use: Yes    Alcohol/week: 0.0 standard drinks    Comment: seldom  . Drug use: No    Allergies  Allergen Reactions  . Shellfish Allergy Anaphylaxis  . Tape Rash    Paper tape is OK    Current Outpatient Medications  Medication Sig Dispense Refill  .  Cholecalciferol (VITAMIN D) 2000 units CAPS Take by mouth.    . Continuous Blood Gluc Sensor (FREESTYLE LIBRE 14 DAY SENSOR) MISC 1 each by Does not apply route every 14 (fourteen) days. 6 each 3  . fluticasone (FLONASE) 50 MCG/ACT nasal spray Place into the nose.    Marland Kitchen glucose blood (CONTOUR NEXT TEST) test strip Use as instructed to test blood glucose 1-3 times daily 100 each 12  . levonorgestrel (MIRENA) 20 MCG/24HR IUD 1 each by Intrauterine route once.    . loratadine (CLARITIN) 10 MG tablet Take 10 mg by mouth daily.    . meloxicam (MOBIC) 15 MG tablet Take 1 tablet (15 mg total) by mouth daily. 30 tablet 0  . montelukast (SINGULAIR) 10 MG tablet Take 1 tablet (10 mg total) by mouth daily. 90 tablet 3  . Peak Flow Meter DEVI Use to monitor peak flow daily 1 each 0   No current facility-administered medications for this visit.     Review of Systems Review of Systems  Constitutional: Negative.   Respiratory: Negative.   Cardiovascular: Negative.     Blood pressure 122/74, pulse (!) 54, temperature 97.7 F (36.5 C), resp. rate 14, height 5\' 4"  (1.626  m), weight 118 lb 6.4 oz (53.7 kg), SpO2 98 %.  Physical Exam Physical Exam Exam conducted with a chaperone present.  Constitutional:      Appearance: She is well-developed.  Eyes:     General: No scleral icterus.    Conjunctiva/sclera: Conjunctivae normal.  Neck:     Musculoskeletal: Neck supple.  Cardiovascular:     Rate and Rhythm: Normal rate and regular rhythm.     Heart sounds: Normal heart sounds.  Pulmonary:     Effort: Pulmonary effort is normal.     Breath sounds: Normal breath sounds.  Chest:     Breasts:        Right: No inverted nipple, mass, nipple discharge, skin change or tenderness.        Left: Mass (There is a mass at 2 o'clk 8 cm from the nipple of the left breast. ) present. No inverted nipple, nipple discharge, skin change or tenderness.    Lymphadenopathy:     Cervical: No cervical adenopathy.      Upper Body:     Right upper body: No supraclavicular or axillary adenopathy.     Left upper body: No supraclavicular or axillary adenopathy.  Skin:    General: Skin is warm and dry.  Neurological:     Mental Status: She is alert and oriented to person, place, and time.  Psychiatric:        Mood and Affect: Mood normal.     Data Reviewed Ultrasound examination of the area of palpable thickening in the axillary tail of the right breast was undertaken.  Imaging showed a hypoechoic nodule with a hyperechoic core consistent with a lymph node.  This measured 0.26 x 0.61 x 0.75 cm.  No other lesions are noted in the upper outer quadrant of the breast or axilla.  BI-RADS-1.  Assessment Prominent lymph node based on recent weight loss.  Negative ultrasound exam.  Plan Observation alone is warranted.  She has been asked to check this area on a monthly basis to confirm stability.  If increases in size, becomes tender or is associated with additional lymph nodes in the area, she has been asked to return for reassessment.   HPI, Physical Exam, Assessment and Plan have been scribed under the direction and in the presence of Robert Bellow, MD  Concepcion Living, LPN  I have completed the exam and reviewed the above documentation for accuracy and completeness.  I agree with the above.  Haematologist has been used and any errors in dictation or transcription are unintentional.  Hervey Ard, M.D., F.A.C.S. [facility] Terita Hejl 07/08/2018, 6:17 PM

## 2018-07-07 NOTE — Patient Instructions (Signed)
   Follow-up with our office as needed.  Please call and ask to speak with a nurse if you develop questions or concerns.  

## 2018-07-08 DIAGNOSIS — N6321 Unspecified lump in the left breast, upper outer quadrant: Secondary | ICD-10-CM | POA: Insufficient documentation

## 2018-08-17 ENCOUNTER — Encounter: Payer: Self-pay | Admitting: Family Medicine

## 2018-08-17 DIAGNOSIS — R7303 Prediabetes: Secondary | ICD-10-CM

## 2018-08-17 DIAGNOSIS — R768 Other specified abnormal immunological findings in serum: Secondary | ICD-10-CM

## 2018-08-17 DIAGNOSIS — E782 Mixed hyperlipidemia: Secondary | ICD-10-CM

## 2018-08-19 DIAGNOSIS — E782 Mixed hyperlipidemia: Secondary | ICD-10-CM | POA: Diagnosis not present

## 2018-08-19 DIAGNOSIS — R768 Other specified abnormal immunological findings in serum: Secondary | ICD-10-CM | POA: Diagnosis not present

## 2018-08-19 DIAGNOSIS — R7303 Prediabetes: Secondary | ICD-10-CM | POA: Diagnosis not present

## 2018-08-24 LAB — LIPID PANEL
Chol/HDL Ratio: 2.6 ratio (ref 0.0–4.4)
Cholesterol, Total: 172 mg/dL (ref 100–199)
HDL: 67 mg/dL (ref >39)
LDL Calculated: 98 mg/dL (ref 0–99)
Triglycerides: 35 mg/dL (ref 0–149)
VLDL Cholesterol Cal: 7 mg/dL (ref 5–40)

## 2018-08-24 LAB — GLUTAMIC ACID DECARBOXYLASE AUTO ABS: Glutamic Acid Decarb Ab: 13.3 U/mL — ABNORMAL HIGH (ref 0.0–5.0)

## 2018-08-24 LAB — C-PEPTIDE: C-Peptide: 1.1 ng/mL (ref 1.1–4.4)

## 2018-08-24 LAB — HEMOGLOBIN A1C
Est. average glucose Bld gHb Est-mCnc: 117 mg/dL
Hgb A1c MFr Bld: 5.7 % — ABNORMAL HIGH (ref 4.8–5.6)

## 2018-09-08 ENCOUNTER — Ambulatory Visit: Payer: 59 | Admitting: Family Medicine

## 2018-10-06 DIAGNOSIS — H5213 Myopia, bilateral: Secondary | ICD-10-CM | POA: Diagnosis not present

## 2018-10-06 DIAGNOSIS — H524 Presbyopia: Secondary | ICD-10-CM | POA: Diagnosis not present

## 2018-10-06 DIAGNOSIS — H52223 Regular astigmatism, bilateral: Secondary | ICD-10-CM | POA: Diagnosis not present

## 2018-11-21 ENCOUNTER — Encounter: Payer: Self-pay | Admitting: Family Medicine

## 2018-11-22 ENCOUNTER — Other Ambulatory Visit: Payer: Self-pay

## 2018-11-22 DIAGNOSIS — R7303 Prediabetes: Secondary | ICD-10-CM

## 2018-11-22 DIAGNOSIS — R768 Other specified abnormal immunological findings in serum: Secondary | ICD-10-CM

## 2018-11-22 MED ORDER — CONTOUR NEXT TEST VI STRP: ORAL_STRIP | 12 refills | Status: DC

## 2018-11-23 ENCOUNTER — Other Ambulatory Visit: Payer: Self-pay

## 2018-11-23 ENCOUNTER — Encounter: Payer: Self-pay | Admitting: Family Medicine

## 2018-11-23 ENCOUNTER — Ambulatory Visit (INDEPENDENT_AMBULATORY_CARE_PROVIDER_SITE_OTHER): Payer: 59 | Admitting: Family Medicine

## 2018-11-23 DIAGNOSIS — Z23 Encounter for immunization: Secondary | ICD-10-CM

## 2018-11-23 NOTE — Progress Notes (Signed)
Patient here for TDAP vaccination only.  I did not examine the patient.  I did review her medical history, medications, and allergies and vaccine consent form.  CMA gave vaccination. Patient tolerated well.  Virginia Crews, MD, MPH [facility] 11/23/2018 2:37 PM

## 2019-01-30 ENCOUNTER — Other Ambulatory Visit: Payer: Self-pay | Admitting: Family Medicine

## 2019-01-30 DIAGNOSIS — Z1231 Encounter for screening mammogram for malignant neoplasm of breast: Secondary | ICD-10-CM

## 2019-02-23 ENCOUNTER — Encounter: Payer: Self-pay | Admitting: Family Medicine

## 2019-02-23 DIAGNOSIS — Z862 Personal history of diseases of the blood and blood-forming organs and certain disorders involving the immune mechanism: Secondary | ICD-10-CM

## 2019-02-23 DIAGNOSIS — Z9009 Acquired absence of other part of head and neck: Secondary | ICD-10-CM

## 2019-02-23 DIAGNOSIS — R768 Other specified abnormal immunological findings in serum: Secondary | ICD-10-CM

## 2019-02-23 DIAGNOSIS — E892 Postprocedural hypoparathyroidism: Secondary | ICD-10-CM

## 2019-02-23 DIAGNOSIS — R7303 Prediabetes: Secondary | ICD-10-CM

## 2019-02-23 DIAGNOSIS — E559 Vitamin D deficiency, unspecified: Secondary | ICD-10-CM

## 2019-03-11 LAB — CBC WITH DIFFERENTIAL/PLATELET
Basophils Absolute: 0 10*3/uL (ref 0.0–0.2)
Basos: 1 %
EOS (ABSOLUTE): 0 10*3/uL (ref 0.0–0.4)
Eos: 1 %
Hematocrit: 41.3 % (ref 34.0–46.6)
Hemoglobin: 13.4 g/dL (ref 11.1–15.9)
Immature Grans (Abs): 0 10*3/uL (ref 0.0–0.1)
Immature Granulocytes: 0 %
Lymphocytes Absolute: 1.8 10*3/uL (ref 0.7–3.1)
Lymphs: 31 %
MCH: 29.3 pg (ref 26.6–33.0)
MCHC: 32.4 g/dL (ref 31.5–35.7)
MCV: 90 fL (ref 79–97)
Monocytes Absolute: 0.5 10*3/uL (ref 0.1–0.9)
Monocytes: 9 %
Neutrophils Absolute: 3.4 10*3/uL (ref 1.4–7.0)
Neutrophils: 58 %
Platelets: 208 10*3/uL (ref 150–450)
RBC: 4.58 x10E6/uL (ref 3.77–5.28)
RDW: 11.8 % (ref 11.7–15.4)
WBC: 5.8 10*3/uL (ref 3.4–10.8)

## 2019-03-11 LAB — HEMOGLOBIN A1C
Est. average glucose Bld gHb Est-mCnc: 123 mg/dL
Hgb A1c MFr Bld: 5.9 % — ABNORMAL HIGH (ref 4.8–5.6)

## 2019-03-11 LAB — LIPID PANEL
Chol/HDL Ratio: 2.8 ratio (ref 0.0–4.4)
Cholesterol, Total: 211 mg/dL — ABNORMAL HIGH (ref 100–199)
HDL: 75 mg/dL (ref >39)
LDL Chol Calc (NIH): 129 mg/dL — ABNORMAL HIGH (ref 0–99)
Triglycerides: 38 mg/dL (ref 0–149)
VLDL Cholesterol Cal: 7 mg/dL (ref 5–40)

## 2019-03-11 LAB — COMPREHENSIVE METABOLIC PANEL
ALT: 31 IU/L (ref 0–32)
AST: 31 IU/L (ref 0–40)
Albumin/Globulin Ratio: 2.4 — ABNORMAL HIGH (ref 1.2–2.2)
Albumin: 4.8 g/dL (ref 3.8–4.8)
Alkaline Phosphatase: 45 IU/L (ref 39–117)
BUN/Creatinine Ratio: 21 (ref 9–23)
BUN: 15 mg/dL (ref 6–24)
Bilirubin Total: 0.6 mg/dL (ref 0.0–1.2)
CO2: 25 mmol/L (ref 20–29)
Calcium: 9.5 mg/dL (ref 8.7–10.2)
Chloride: 102 mmol/L (ref 96–106)
Creatinine, Ser: 0.73 mg/dL (ref 0.57–1.00)
GFR calc Af Amer: 113 mL/min/{1.73_m2} (ref >59)
GFR calc non Af Amer: 98 mL/min/{1.73_m2} (ref >59)
Globulin, Total: 2 g/dL (ref 1.5–4.5)
Glucose: 104 mg/dL — ABNORMAL HIGH (ref 65–99)
Potassium: 4.1 mmol/L (ref 3.5–5.2)
Sodium: 142 mmol/L (ref 134–144)
Total Protein: 6.8 g/dL (ref 6.0–8.5)

## 2019-03-11 LAB — GLUTAMIC ACID DECARBOXYLASE AUTO ABS: Glutamic Acid Decarb Ab: 13.6 U/mL — ABNORMAL HIGH (ref 0.0–5.0)

## 2019-03-11 LAB — TSH: TSH: 1.84 u[IU]/mL (ref 0.450–4.500)

## 2019-03-11 LAB — C-PEPTIDE: C-Peptide: 1.8 ng/mL (ref 1.1–4.4)

## 2019-03-16 ENCOUNTER — Ambulatory Visit (INDEPENDENT_AMBULATORY_CARE_PROVIDER_SITE_OTHER): Payer: No Typology Code available for payment source | Admitting: Family Medicine

## 2019-03-16 ENCOUNTER — Encounter: Payer: Self-pay | Admitting: Family Medicine

## 2019-03-16 ENCOUNTER — Other Ambulatory Visit: Payer: Self-pay

## 2019-03-16 VITALS — BP 104/65 | HR 62 | Temp 97.1°F | Resp 16 | Ht 64.0 in | Wt 117.0 lb

## 2019-03-16 DIAGNOSIS — R768 Other specified abnormal immunological findings in serum: Secondary | ICD-10-CM

## 2019-03-16 DIAGNOSIS — Z1231 Encounter for screening mammogram for malignant neoplasm of breast: Secondary | ICD-10-CM

## 2019-03-16 DIAGNOSIS — L819 Disorder of pigmentation, unspecified: Secondary | ICD-10-CM

## 2019-03-16 DIAGNOSIS — Z0000 Encounter for general adult medical examination without abnormal findings: Secondary | ICD-10-CM | POA: Diagnosis not present

## 2019-03-16 DIAGNOSIS — R7303 Prediabetes: Secondary | ICD-10-CM | POA: Diagnosis not present

## 2019-03-16 NOTE — Patient Instructions (Signed)

## 2019-03-16 NOTE — Assessment & Plan Note (Addendum)
Remain elevated A1c still in prediabetic range Reviewed labs with patient  Continue low carb diet

## 2019-03-16 NOTE — Progress Notes (Signed)
Patient: Tanya Adams, Female    DOB: 1970-05-27, 49 y.o.   MRN: [id number] Visit Date: 03/16/2019  Today's Provider: Lavon Paganini, MD   Chief Complaint  Patient presents with  . Annual Exam   Subjective:     Annual physical exam Tanya Adams is a 49 y.o. female who presents today for health maintenance and complete physical. She feels well. She reports exercising daily. She reports she is sleeping well.  02/24/2018 Pap/HPV-negative 03/15/2018 Mammogram-BI-RADS 1 03/19/2014 Colonoscopy-normal -----------------------------------------------------------------   Review of Systems  Constitutional: Negative.   HENT: Negative.   Eyes: Positive for itching.  Respiratory: Negative.   Cardiovascular: Negative.   Gastrointestinal: Negative.   Endocrine: Negative.   Genitourinary: Negative.   Musculoskeletal: Negative.   Skin: Positive for color change.  Allergic/Immunologic: Positive for environmental allergies and food allergies.  Neurological: Negative.   Hematological: Positive for adenopathy.  Psychiatric/Behavioral: The patient is nervous/anxious and is hyperactive.     Social History      She  reports that she has never smoked. She has never used smokeless tobacco. She reports current alcohol use. She reports that she does not use drugs.       Social History   Socioeconomic History  . Marital status: Married    Spouse name: Not on file  . Number of children: 2  . Years of education: Not on file  . Highest education level: Doctorate  Occupational History  . Occupation: PHYSICIAN    Employer: [employer]  Tobacco Use  . Smoking status: Never Smoker  . Smokeless tobacco: Never Used  Substance and Sexual Activity  . Alcohol use: Yes    Alcohol/week: 0.0 standard drinks    Comment: seldom  . Drug use: No  . Sexual activity: Yes    Birth control/protection: Surgical, I.U.D.  Other Topics Concern  . Not on file  Social History  Narrative  . Not on file   Social Determinants of Health   Financial Resource Strain:   . Difficulty of Paying Living Expenses: Not on file  Food Insecurity:   . Worried About Charity fundraiser in the Last Year: Not on file  . Ran Out of Food in the Last Year: Not on file  Transportation Needs:   . Lack of Transportation (Medical): Not on file  . Lack of Transportation (Non-Medical): Not on file  Physical Activity:   . Days of Exercise per Week: Not on file  . Minutes of Exercise per Session: Not on file  Stress:   . Feeling of Stress : Not on file  Social Connections:   . Frequency of Communication with Friends and Family: Not on file  . Frequency of Social Gatherings with Friends and Family: Not on file  . Attends Religious Services: Not on file  . Active Member of Clubs or Organizations: Not on file  . Attends Archivist Meetings: Not on file  . Marital Status: Not on file    Past Medical History:  Diagnosis Date  . Allergic rhinitis   . Anemia   . Asthma   . Dysmenorrhea   . History of miscarriage   . Vitamin D deficiency      Patient Active Problem List   Diagnosis Date Noted  . Mass of upper outer quadrant of left breast 07/08/2018  . Prediabetes 03/10/2018  . Autoantibody titer elevated 03/10/2018  . Asthma exacerbation 04/03/2015  . Asthma with allergic rhinitis 02/15/2015  . Esophagitis, reflux  02/15/2015  . H/O parathyroidectomy 02/15/2015  . H/O tubal ligation 02/15/2015  . Avitaminosis D 02/15/2015  . Allergic rhinitis 07/31/2014  . History of anemia 02/22/2014    Past Surgical History:  Procedure Laterality Date  . BREAST CYST ASPIRATION Right   . CESAREAN SECTION    . KNEE ARTHROSCOPY    . NECK EXPLORATION    . PARATHYROIDECTOMY    . TUBAL LIGATION      Family History        Family Status  Relation Name Status  . Mother  Alive  . Father  Alive  . Brother  Alive  . Neg Hx  (Not Specified)        Her family history  includes Benign prostatic hyperplasia in her father; Depression in her mother; Diabetes in her father and mother; Healthy in her brother; Heart disease in her father; Hypertension in her mother. There is no history of Breast cancer, Colon cancer, or Ovarian cancer.      Allergies  Allergen Reactions  . Shellfish Allergy Anaphylaxis  . Tape Rash    Paper tape is OK     Current Outpatient Medications:  .  albuterol (VENTOLIN HFA) 108 (90 Base) MCG/ACT inhaler, Inhale 2 puffs into the lungs every 6 (six) hours as needed for wheezing or shortness of breath., Disp: , Rfl:  .  Cholecalciferol (VITAMIN D) 2000 units CAPS, Take by mouth., Disp: , Rfl:  .  Continuous Blood Gluc Sensor (FREESTYLE LIBRE 14 DAY SENSOR) MISC, 1 each by Does not apply route every 14 (fourteen) days., Disp: 6 each, Rfl: 3 .  fluticasone (FLONASE) 50 MCG/ACT nasal spray, Place into the nose., Disp: , Rfl:  .  glucose blood (CONTOUR NEXT TEST) test strip, Use as instructed to test blood glucose 1-3 times daily, Disp: 100 each, Rfl: 12 .  levonorgestrel (MIRENA) 20 MCG/24HR IUD, 1 each by Intrauterine route once., Disp: , Rfl:  .  loratadine (CLARITIN) 10 MG tablet, Take 10 mg by mouth daily., Disp: , Rfl:  .  meloxicam (MOBIC) 15 MG tablet, Take 1 tablet (15 mg total) by mouth daily., Disp: 30 tablet, Rfl: 0 .  montelukast (SINGULAIR) 10 MG tablet, Take 1 tablet (10 mg total) by mouth daily., Disp: 90 tablet, Rfl: 3 .  Peak Flow Meter DEVI, Use to monitor peak flow daily, Disp: 1 each, Rfl: 0   Patient Care Team: Virginia Crews, MD as PCP - General (Family Medicine)    Objective:    Vitals: BP 104/65 (BP Location: Right Arm, Patient Position: Sitting, Cuff Size: Normal)   Pulse 62   Temp (!) 97.1 F (36.2 C) (Temporal)   Resp 16   Ht _0  (1.626 m)   Wt 117 lb (53.1 kg)   BMI 20.08 kg/m    Vitals:   03/16/19 1330  BP: 104/65  Pulse: 62  Resp: 16  Temp: (!) 97.1 F (36.2 C)  TempSrc: Temporal    Weight: 117 lb (53.1 kg)  Height: _1  (1.626 m)     Physical Exam Vitals reviewed.  Constitutional:      General: She is not in acute distress.    Appearance: Normal appearance. She is well-developed. She is not diaphoretic.  HENT:     Head: Normocephalic and atraumatic.     Right Ear: External ear normal.     Left Ear: External ear normal.  Eyes:     General: No scleral icterus.    Conjunctiva/sclera: Conjunctivae normal.  Pupils: Pupils are equal, round, and reactive to light.  Neck:     Thyroid: No thyromegaly.  Cardiovascular:     Rate and Rhythm: Normal rate and regular rhythm.     Pulses: Normal pulses.     Heart sounds: Normal heart sounds. No murmur.  Pulmonary:     Effort: Pulmonary effort is normal. No respiratory distress.     Breath sounds: Normal breath sounds. No wheezing or rales.  Abdominal:     General: There is no distension.     Palpations: Abdomen is soft.     Tenderness: There is no abdominal tenderness. There is no guarding or rebound.  Musculoskeletal:        General: No deformity.     Cervical back: Neck supple.     Right lower leg: No edema.     Left lower leg: No edema.  Lymphadenopathy:     Cervical: No cervical adenopathy.  Skin:    General: Skin is warm and dry.     Findings: No rash.  Neurological:     Mental Status: She is alert and oriented to person, place, and time. Mental status is at baseline.  Psychiatric:        Mood and Affect: Mood normal.        Behavior: Behavior normal.        Thought Content: Thought content normal.     Depression Screen PHQ 2/9 Scores 03/16/2019 03/11/2017  PHQ - 2 Score 0 0  PHQ- 9 Score 0 -       Assessment & Plan:     Routine Health Maintenance and Physical Exam  Exercise Activities and Dietary recommendations Goals   None     Immunization History  Administered Date(s) Administered  . Hepatitis B 03/17/1992, 04/17/1992, 09/22/1992  . Influenza,inj,Quad PF,6+ Mos 10/06/2016,  10/18/2017  . Influenza-Unspecified 10/02/2014, 10/18/2018  . MMR 09/21/1996, 08/08/2010  . Pneumococcal Polysaccharide-23 10/18/2017  . Td 09/21/1996  . Tdap 04/26/2007, 11/23/2018  . Varicella 09/21/1996  . Yellow Fever 05/07/2016    Health Maintenance  Topic Date Due  . HIV Screening  03/03/1986  . PAP SMEAR-Modifier  02/24/2021  . TETANUS/TDAP  11/22/2028  . INFLUENZA VACCINE  Completed     Discussed health benefits of physical activity, and encouraged her to engage in regular exercise appropriate for her age and condition.    --------------------------------------------------------------------  Problem List Items Addressed This Visit      Other   Prediabetes    Slight worsening in A1c Continue low carb diet Reviewed recent labs Repeat in 6 months      Autoantibody titer elevated    Remain elevated A1c still in prediabetic range Reviewed labs with patient  Continue low carb diet       Other Visit Diagnoses    Encounter for annual physical exam    -  Primary   Relevant Orders   MM 3D SCREEN BREAST BILATERAL   Breast cancer screening by mammogram       Relevant Orders   MM 3D SCREEN BREAST BILATERAL   Hyperpigmentation       Relevant Orders   Ambulatory referral to Dermatology       Return in about 6 months (around 09/16/2019) for chronic disease f/u.   The entirety of the information documented in the History of Present Illness, Review of Systems and Physical Exam were personally obtained by me. Portions of this information were initially documented by Lynford Humphrey, CMA and reviewed by me for  thoroughness and accuracy.    Loralie Malta, Dionne Bucy, MD MPH [facility]

## 2019-03-16 NOTE — Assessment & Plan Note (Signed)
Slight worsening in A1c Continue low carb diet Reviewed recent labs Repeat in 6 months

## 2019-04-06 ENCOUNTER — Telehealth: Payer: Self-pay

## 2019-04-06 ENCOUNTER — Ambulatory Visit
Admission: RE | Admit: 2019-04-06 | Discharge: 2019-04-06 | Disposition: A | Discharge status: Home / Self Care | Payer: No Typology Code available for payment source | Source: Ambulatory Visit | Attending: Family Medicine | Admitting: Family Medicine

## 2019-04-06 DIAGNOSIS — Z1231 Encounter for screening mammogram for malignant neoplasm of breast: Secondary | ICD-10-CM | POA: Diagnosis present

## 2019-04-06 NOTE — Telephone Encounter (Signed)
-----   Message from Virginia Crews, MD sent at 04/06/2019  4:31 PM EDT ----- Normal mammogram. Repeat in 1 yr

## 2019-04-06 NOTE — Telephone Encounter (Signed)
Comment seen by patient Tanya Adams on 04/06/2019 4:36 PM EDT

## 2019-04-11 ENCOUNTER — Encounter: Payer: Self-pay | Admitting: Family Medicine

## 2019-05-11 ENCOUNTER — Other Ambulatory Visit: Payer: Self-pay | Admitting: Family Medicine

## 2019-05-11 DIAGNOSIS — J4540 Moderate persistent asthma, uncomplicated: Secondary | ICD-10-CM

## 2019-05-11 NOTE — Telephone Encounter (Signed)
Requested Prescriptions  Pending Prescriptions Disp Refills  . montelukast (SINGULAIR) 10 MG tablet [Pharmacy Med Name: MONTELUKAST SOD 10 MG TAB 10 Tablet] 90 tablet 3    Sig: TAKE 1 TABLET (10 MG TOTAL) BY MOUTH DAILY.     Pulmonology:  Leukotriene Inhibitors Passed - 05/11/2019  2:55 PM      Passed - Valid encounter within last 12 months    Recent Outpatient Visits          1 month ago Encounter for annual physical exam   [facility] [place], Dionne Bucy, MD   5 months ago Need for Tdap vaccination   [facility], Dionne Bucy, MD   1 year ago Prediabetes   [facility], Dionne Bucy, MD   1 year ago Cough   [facility], Dionne Bucy, MD   1 year ago Need for influenza vaccination   [facility] [place], Dionne Bucy, MD      Future Appointments            In 4 months [place], Dionne Bucy, MD [facility], [place]

## 2019-05-23 ENCOUNTER — Encounter: Payer: Self-pay | Admitting: Family Medicine

## 2019-05-24 MED ORDER — TRETINOIN 0.025 % EX GEL: Freq: Every day | CUTANEOUS | 5 refills | Status: DC

## 2019-06-24 ENCOUNTER — Encounter: Payer: Self-pay | Admitting: Family Medicine

## 2019-06-24 DIAGNOSIS — Z0100 Encounter for examination of eyes and vision without abnormal findings: Secondary | ICD-10-CM

## 2019-06-26 MED ORDER — TRETINOIN 0.025 % EX CREA: TOPICAL_CREAM | Freq: Every day | CUTANEOUS | 1 refills | Status: DC

## 2019-07-12 MED ORDER — TRETINOIN 0.025 % EX CREA: TOPICAL_CREAM | Freq: Every day | CUTANEOUS | 1 refills | Status: DC

## 2019-07-12 NOTE — Addendum Note (Signed)
Addended by: Mar Daring on: 07/12/2019 10:00 AM   Modules accepted: Orders

## 2019-08-09 ENCOUNTER — Encounter: Payer: Self-pay | Admitting: Family Medicine

## 2019-08-09 DIAGNOSIS — R059 Cough, unspecified: Secondary | ICD-10-CM

## 2019-08-09 MED ORDER — BENZONATATE 100 MG PO CAPS: 100.0000 mg | ORAL_CAPSULE | Freq: Three times a day (TID) | ORAL | 1 refills | Status: AC | PRN

## 2019-09-21 ENCOUNTER — Other Ambulatory Visit: Payer: Self-pay

## 2019-09-21 ENCOUNTER — Encounter: Payer: Self-pay | Admitting: Family Medicine

## 2019-09-21 ENCOUNTER — Ambulatory Visit (INDEPENDENT_AMBULATORY_CARE_PROVIDER_SITE_OTHER): Payer: No Typology Code available for payment source | Admitting: Family Medicine

## 2019-09-21 VITALS — BP 99/64 | HR 59 | Temp 98.4°F | Resp 16 | Ht 64.0 in | Wt 114.0 lb

## 2019-09-21 DIAGNOSIS — J4540 Moderate persistent asthma, uncomplicated: Secondary | ICD-10-CM

## 2019-09-21 DIAGNOSIS — J4520 Mild intermittent asthma, uncomplicated: Secondary | ICD-10-CM | POA: Diagnosis not present

## 2019-09-21 DIAGNOSIS — R768 Other specified abnormal immunological findings in serum: Secondary | ICD-10-CM

## 2019-09-21 DIAGNOSIS — R7303 Prediabetes: Secondary | ICD-10-CM

## 2019-09-21 MED ORDER — BENZONATATE 100 MG PO CAPS: 100.0000 mg | ORAL_CAPSULE | Freq: Two times a day (BID) | ORAL | 0 refills | Status: DC | PRN

## 2019-09-21 MED ORDER — TRETINOIN 0.05 % EX CREA: TOPICAL_CREAM | Freq: Every day | CUTANEOUS | 11 refills | Status: AC

## 2019-09-21 MED ORDER — MONTELUKAST SODIUM 10 MG PO TABS
10.0000 mg | ORAL_TABLET | Freq: Every day | ORAL | 3 refills | Status: DC
  Filled 2020-04-29: qty 90, 90d supply, fill #0

## 2019-09-21 MED ORDER — MELOXICAM 15 MG PO TABS: 15.0000 mg | ORAL_TABLET | Freq: Every day | ORAL | 3 refills | Status: DC

## 2019-09-21 NOTE — Progress Notes (Signed)
I,Sulibeya S Dimas,acting as a scribe for Lavon Paganini, MD.,have documented all relevant documentation on the behalf of Lavon Paganini, MD,as directed by  Lavon Paganini, MD while in the presence of Lavon Paganini, MD.   Established patient visit   Patient: Tanya Adams   DOB: 12-Sep-1970   49 y.o. Female  MRN: [id number] Visit Date: 09/21/2019  Today's healthcare provider: Lavon Paganini, MD   Chief Complaint  Patient presents with  . Prediabetes   Subjective    HPI  Prediabetes, Follow-up  Lab Results  Component Value Date   HGBA1C 5.9 (H) 03/10/2019   HGBA1C 5.7 (H) 08/19/2018   HGBA1C 5.6 05/25/2018   GLUCOSE 104 (H) 03/10/2019   GLUCOSE 93 03/02/2018   GLUCOSE 89 03/12/2017    Last seen for for this 6 months ago.  Management since that visit includes checking A1c. Current symptoms include none and have been stable.  Prior visit with dietician: no Current diet: in general, a "healthy" diet   Current exercise: walking  Pertinent Labs:    Component Value Date/Time   CHOL 211 (H) 03/10/2019 0837   TRIG 38 03/10/2019 0837   CHOLHDL 2.8 03/10/2019 0837   CREATININE 0.73 03/10/2019 0837    Wt Readings from Last 3 Encounters:  09/21/19 114 lb (51.7 kg)  03/16/19 117 lb (53.1 kg)  07/07/18 118 lb 6.4 oz (53.7 kg)    -----------------------------------------------------------------------------------------   Patient Active Problem List   Diagnosis Date Noted  . Mass of upper outer quadrant of left breast 07/08/2018  . Prediabetes 03/10/2018  . Autoantibody titer elevated 03/10/2018  . Asthma exacerbation 04/03/2015  . Asthma with allergic rhinitis 02/15/2015  . Esophagitis, reflux 02/15/2015  . H/O parathyroidectomy 02/15/2015  . H/O tubal ligation 02/15/2015  . Avitaminosis D 02/15/2015  . Allergic rhinitis 07/31/2014  . History of anemia 02/22/2014   Social History   Tobacco Use  . Smoking status: Never Smoker  . Smokeless  tobacco: Never Used  Vaping Use  . Vaping Use: Never used  Substance Use Topics  . Alcohol use: Yes    Alcohol/week: 0.0 standard drinks    Comment: seldom  . Drug use: No   Allergies  Allergen Reactions  . Shellfish Allergy Anaphylaxis  . Tape Rash    Paper tape is OK       Medications: Outpatient Medications Prior to Visit  Medication Sig  . albuterol (VENTOLIN HFA) 108 (90 Base) MCG/ACT inhaler Inhale 2 puffs into the lungs every 6 (six) hours as needed for wheezing or shortness of breath.  . Cholecalciferol (VITAMIN D) 2000 units CAPS Take by mouth.  . Continuous Blood Gluc Sensor (FREESTYLE LIBRE 14 DAY SENSOR) MISC 1 each by Does not apply route every 14 (fourteen) days.  . fluticasone (FLONASE) 50 MCG/ACT nasal spray Place into the nose.  Marland Kitchen glucose blood (CONTOUR NEXT TEST) test strip Use as instructed to test blood glucose 1-3 times daily  . levonorgestrel (MIRENA) 20 MCG/24HR IUD 1 each by Intrauterine route once.  . loratadine (CLARITIN) 10 MG tablet Take 10 mg by mouth daily.  . Peak Flow Meter DEVI Use to monitor peak flow daily  . [DISCONTINUED] meloxicam (MOBIC) 15 MG tablet Take 1 tablet (15 mg total) by mouth daily.  . [DISCONTINUED] montelukast (SINGULAIR) 10 MG tablet TAKE 1 TABLET (10 MG TOTAL) BY MOUTH DAILY.  . [DISCONTINUED] tretinoin (RETIN-A) 0.025 % cream Apply topically at bedtime.   No facility-administered medications prior to visit.  Review of Systems  Constitutional: Negative.   Respiratory: Negative.   Cardiovascular: Negative.   Endocrine: Negative.   Neurological: Negative.     Last CBC Lab Results  Component Value Date   WBC 5.8 03/10/2019   HGB 13.4 03/10/2019   HCT 41.3 03/10/2019   MCV 90 03/10/2019   MCH 29.3 03/10/2019   RDW 11.8 03/10/2019   PLT 208 91/47/8295   Last metabolic panel Lab Results  Component Value Date   GLUCOSE 104 (H) 03/10/2019   NA 142 03/10/2019   K 4.1 03/10/2019   CL 102 03/10/2019   CO2 25  03/10/2019   BUN 15 03/10/2019   CREATININE 0.73 03/10/2019   GFRNONAA 98 03/10/2019   GFRAA 113 03/10/2019   CALCIUM 9.5 03/10/2019   PROT 6.8 03/10/2019   ALBUMIN 4.8 03/10/2019   LABGLOB 2.0 03/10/2019   AGRATIO 2.4 (H) 03/10/2019   BILITOT 0.6 03/10/2019   ALKPHOS 45 03/10/2019   AST 31 03/10/2019   ALT 31 03/10/2019   Last lipids Lab Results  Component Value Date   CHOL 211 (H) 03/10/2019   HDL 75 03/10/2019   LDLCALC 129 (H) 03/10/2019   TRIG 38 03/10/2019   CHOLHDL 2.8 03/10/2019   Last hemoglobin A1c Lab Results  Component Value Date   HGBA1C 5.9 (H) 03/10/2019   Last thyroid functions Lab Results  Component Value Date   TSH 1.840 03/10/2019   Last vitamin D Lab Results  Component Value Date   VD25OH 46.7 05/25/2018   Last vitamin B12 and Folate Lab Results  Component Value Date   VITAMINB12 588 05/25/2018      Objective    BP 99/64 (BP Location: Left Arm, Patient Position: Sitting, Cuff Size: Normal)   Pulse (!) 59   Temp 98.4 F (36.9 C) (Oral)   Resp 16   Ht 5\' 4"  (1.626 m)   Wt 114 lb (51.7 kg)   BMI 19.57 kg/m  BP Readings from Last 3 Encounters:  09/21/19 99/64  03/16/19 104/65  07/07/18 122/74   Wt Readings from Last 3 Encounters:  09/21/19 114 lb (51.7 kg)  03/16/19 117 lb (53.1 kg)  07/07/18 118 lb 6.4 oz (53.7 kg)      Physical Exam Vitals reviewed.  Constitutional:      General: She is not in acute distress.    Appearance: Normal appearance. She is well-developed. She is not diaphoretic.  HENT:     Head: Normocephalic and atraumatic.  Eyes:     General: No scleral icterus.    Conjunctiva/sclera: Conjunctivae normal.  Neck:     Thyroid: No thyromegaly.  Cardiovascular:     Rate and Rhythm: Normal rate and regular rhythm.     Pulses: Normal pulses.     Heart sounds: Normal heart sounds. No murmur heard.   Pulmonary:     Effort: Pulmonary effort is normal. No respiratory distress.     Breath sounds: Normal breath  sounds. No wheezing, rhonchi or rales.  Musculoskeletal:     Cervical back: Neck supple.  Lymphadenopathy:     Cervical: No cervical adenopathy.  Neurological:     Mental Status: She is alert and oriented to person, place, and time. Mental status is at baseline.  Psychiatric:        Mood and Affect: Mood normal.        Behavior: Behavior normal.     No results found for any visits on 09/21/19.  Assessment & Plan     Problem  List Items Addressed This Visit      Respiratory   Asthma with allergic rhinitis    Chronic and well-controlled Continue Claritin, Singulair, Flonase Continue Tessalon and albuterol as needed No recent exacerbation of asthma      Relevant Medications   montelukast (SINGULAIR) 10 MG tablet     Other   Prediabetes - Primary    Continue low-carb diet Did not tolerate GLP-1 Recheck labs      Relevant Orders   Glutamic acid decarboxylase auto abs   C-peptide   Hemoglobin A1c   Autoantibody titer elevated    Previously elevated A1c in prediabetic range Continue low-carb diet Recheck labs      Relevant Orders   Glutamic acid decarboxylase auto abs   C-peptide   Hemoglobin A1c    Other Visit Diagnoses    Moderate persistent reactive airway disease without complication       Relevant Medications   montelukast (SINGULAIR) 10 MG tablet       Return in about 6 months (around 03/20/2020) for CPE.      I, Lavon Paganini, MD, have reviewed all documentation for this visit. The documentation on 09/22/19 for the exam, diagnosis, procedures, and orders are all accurate and complete.   Ilani Otterson, Dionne Bucy, MD, MPH [facility]

## 2019-09-22 ENCOUNTER — Encounter: Payer: Self-pay | Admitting: Family Medicine

## 2019-09-22 NOTE — Assessment & Plan Note (Signed)
Continue low-carb diet Did not tolerate GLP-1 Recheck labs

## 2019-09-22 NOTE — Assessment & Plan Note (Signed)
Chronic and well-controlled Continue Claritin, Singulair, Flonase Continue Tessalon and albuterol as needed No recent exacerbation of asthma

## 2019-09-22 NOTE — Assessment & Plan Note (Signed)
Previously elevated A1c in prediabetic range Continue low-carb diet Recheck labs

## 2019-09-23 ENCOUNTER — Encounter: Payer: Self-pay | Admitting: Family Medicine

## 2019-09-23 DIAGNOSIS — R7303 Prediabetes: Secondary | ICD-10-CM

## 2019-09-25 LAB — GLUTAMIC ACID DECARBOXYLASE AUTO ABS: Glutamic Acid Decarb Ab: 16 IU/mL — ABNORMAL HIGH (ref <5)

## 2019-09-25 LAB — HEMOGLOBIN A1C
Hgb A1c MFr Bld: 5.9 % of total Hgb — ABNORMAL HIGH (ref <5.7)
Mean Plasma Glucose: 123 (calc)
eAG (mmol/L): 6.8 (calc)

## 2019-09-25 LAB — C-PEPTIDE: C-Peptide: 0.47 ng/mL — ABNORMAL LOW (ref 0.80–3.85)

## 2019-09-25 NOTE — Telephone Encounter (Signed)
Yes please call [facility] and add. Thanks

## 2019-09-26 ENCOUNTER — Telehealth: Payer: Self-pay

## 2019-09-26 NOTE — Telephone Encounter (Signed)
They were not able to add Insulin.  They would need a frozen sample.   Thanks,   -Mickel Baas

## 2019-09-26 NOTE — Telephone Encounter (Signed)
Seen by patient Loistine Chance on 09/26/2019 8:31 AM

## 2019-09-26 NOTE — Telephone Encounter (Signed)
Seen by patient Tanya Adams on 09/26/2019 8:32 AM

## 2019-09-26 NOTE — Telephone Encounter (Signed)
-----   Message from Virginia Crews, MD sent at 09/26/2019  8:15 AM EDT ----- Antibody levels also increasing.

## 2019-09-26 NOTE — Telephone Encounter (Signed)
-----   Message from Virginia Crews, MD sent at 09/25/2019 10:53 AM EDT ----- Noted lower C peptide and stable A1c.  Fasting insulin level added by CMA.

## 2019-10-03 LAB — INSULIN, RANDOM: Insulin: 1.7 u[IU]/mL

## 2019-10-20 ENCOUNTER — Encounter: Payer: Self-pay | Admitting: Family Medicine

## 2019-12-22 ENCOUNTER — Encounter: Payer: Self-pay | Admitting: Family Medicine

## 2019-12-22 DIAGNOSIS — R7303 Prediabetes: Secondary | ICD-10-CM

## 2019-12-22 DIAGNOSIS — R768 Other specified abnormal immunological findings in serum: Secondary | ICD-10-CM

## 2020-01-10 LAB — INSULIN AND C-PEPTIDE, SERUM
C-Peptide: 0.8 ng/mL — ABNORMAL LOW (ref 1.1–4.4)
INSULIN: 2.9 u[IU]/mL (ref 2.6–24.9)

## 2020-01-10 LAB — HEMOGLOBIN A1C
Est. average glucose Bld gHb Est-mCnc: 131 mg/dL
Hgb A1c MFr Bld: 6.2 % — ABNORMAL HIGH (ref 4.8–5.6)

## 2020-01-10 LAB — GLUTAMIC ACID DECARBOXYLASE AUTO ABS: Glutamic Acid Decarb Ab: 13.9 U/mL — ABNORMAL HIGH (ref 0.0–5.0)

## 2020-03-29 ENCOUNTER — Other Ambulatory Visit: Payer: Self-pay | Admitting: Family Medicine

## 2020-03-29 DIAGNOSIS — Z1231 Encounter for screening mammogram for malignant neoplasm of breast: Secondary | ICD-10-CM

## 2020-04-04 ENCOUNTER — Encounter: Payer: Self-pay | Admitting: Family Medicine

## 2020-04-04 ENCOUNTER — Other Ambulatory Visit: Payer: Self-pay | Admitting: Family Medicine

## 2020-04-04 ENCOUNTER — Ambulatory Visit (INDEPENDENT_AMBULATORY_CARE_PROVIDER_SITE_OTHER): Payer: No Typology Code available for payment source | Admitting: Family Medicine

## 2020-04-04 ENCOUNTER — Other Ambulatory Visit: Payer: Self-pay

## 2020-04-04 VITALS — BP 104/69 | HR 62 | Temp 98.6°F | Resp 16 | Ht 63.5 in | Wt 120.5 lb

## 2020-04-04 DIAGNOSIS — R768 Other specified abnormal immunological findings in serum: Secondary | ICD-10-CM

## 2020-04-04 DIAGNOSIS — Z0000 Encounter for general adult medical examination without abnormal findings: Secondary | ICD-10-CM

## 2020-04-04 DIAGNOSIS — Z862 Personal history of diseases of the blood and blood-forming organs and certain disorders involving the immune mechanism: Secondary | ICD-10-CM

## 2020-04-04 DIAGNOSIS — E892 Postprocedural hypoparathyroidism: Secondary | ICD-10-CM

## 2020-04-04 DIAGNOSIS — Z1159 Encounter for screening for other viral diseases: Secondary | ICD-10-CM

## 2020-04-04 DIAGNOSIS — E559 Vitamin D deficiency, unspecified: Secondary | ICD-10-CM

## 2020-04-04 DIAGNOSIS — R7303 Prediabetes: Secondary | ICD-10-CM

## 2020-04-04 NOTE — Patient Instructions (Signed)
Preventive Care 2-49 Years Old, Female Preventive care refers to lifestyle choices and visits with your health care provider that can promote health and wellness. This includes:  A yearly physical exam. This is also called an annual wellness visit.  Regular dental and eye exams.  Immunizations.  Screening for certain conditions.  Healthy lifestyle choices, such as: ? Eating a healthy diet. ? Getting regular exercise. ? Not using drugs or products that contain nicotine and tobacco. ? Limiting alcohol use. What can I expect for my preventive care visit? Physical exam Your health care provider will check your:  Height and weight. These may be used to calculate your BMI (body mass index). BMI is a measurement that tells if you are at a healthy weight.  Heart rate and blood pressure.  Body temperature.  Skin for abnormal spots. Counseling Your health care provider may ask you questions about your:  Past medical problems.  Family's medical history.  Alcohol, tobacco, and drug use.  Emotional well-being.  Home life and relationship well-being.  Sexual activity.  Diet, exercise, and sleep habits.  Work and work Statistician.  Access to firearms.  Method of birth control.  Menstrual cycle.  Pregnancy history. What immunizations do I need? Vaccines are usually given at various ages, according to a schedule. Your health care provider will recommend vaccines for you based on your age, medical history, and lifestyle or other factors, such as travel or where you work.   What tests do I need? Blood tests  Lipid and cholesterol levels. These may be checked every 5 years, or more often if you are over 77 years old.  Hepatitis C test.  Hepatitis B test. Screening  Lung cancer screening. You may have this screening every year starting at age 71 if you have a 30-pack-year history of smoking and currently smoke or have quit within the past 15 years.  Colorectal cancer  screening. ? All adults should have this screening starting at age 83 and continuing until age 33. ? Your health care provider may recommend screening at age 56 if you are at increased risk. ? You will have tests every 1-10 years, depending on your results and the type of screening test.  Diabetes screening. ? This is done by checking your blood sugar (glucose) after you have not eaten for a while (fasting). ? You may have this done every 1-3 years.  Mammogram. ? This may be done every 1-2 years. ? Talk with your health care provider about when you should start having regular mammograms. This may depend on whether you have a family history of breast cancer.  BRCA-related cancer screening. This may be done if you have a family history of breast, ovarian, tubal, or peritoneal cancers.  Pelvic exam and Pap test. ? This may be done every 3 years starting at age 74. ? Starting at age 28, this may be done every 5 years if you have a Pap test in combination with an HPV test. Other tests  STD (sexually transmitted disease) testing, if you are at risk.  Bone density scan. This is done to screen for osteoporosis. You may have this scan if you are at high risk for osteoporosis. Talk with your health care provider about your test results, treatment options, and if necessary, the need for more tests. Follow these instructions at home: Eating and drinking  Eat a diet that includes fresh fruits and vegetables, whole grains, lean protein, and low-fat dairy products.  Take vitamin and mineral supplements  as recommended by your health care provider.  Do not drink alcohol if: ? Your health care provider tells you not to drink. ? You are pregnant, may be pregnant, or are planning to become pregnant.  If you drink alcohol: ? Limit how much you have to 0-1 drink a day. ? Be aware of how much alcohol is in your drink. In the [place] one drink equals one 12 oz bottle of beer (355 mL), one 5 oz glass of  wine (148 mL), or one 1 oz glass of hard liquor (44 mL).   Lifestyle  Take daily care of your teeth and gums. Brush your teeth every morning and night with fluoride toothpaste. Floss one time each day.  Stay active. Exercise for at least 30 minutes 5 or more days each week.  Do not use any products that contain nicotine or tobacco, such as cigarettes, e-cigarettes, and chewing tobacco. If you need help quitting, ask your health care provider.  Do not use drugs.  If you are sexually active, practice safe sex. Use a condom or other form of protection to prevent STIs (sexually transmitted infections).  If you do not wish to become pregnant, use a form of birth control. If you plan to become pregnant, see your health care provider for a prepregnancy visit.  If told by your health care provider, take low-dose aspirin daily starting at age 73.  Find healthy ways to cope with stress, such as: ? Meditation, yoga, or listening to music. ? Journaling. ? Talking to a trusted person. ? Spending time with friends and family. Safety  Always wear your seat belt while driving or riding in a vehicle.  Do not drive: ? If you have been drinking alcohol. Do not ride with someone who has been drinking. ? When you are tired or distracted. ? While texting.  Wear a helmet and other protective equipment during sports activities.  If you have firearms in your house, make sure you follow all gun safety procedures. What's next?  Visit your health care provider once a year for an annual wellness visit.  Ask your health care provider how often you should have your eyes and teeth checked.  Stay up to date on all vaccines. This information is not intended to replace advice given to you by your health care provider. Make sure you discuss any questions you have with your health care provider. Document Revised: 10/03/2019 Document Reviewed: 09/09/2017 Elsevier Patient Education  2021 [employer].

## 2020-04-04 NOTE — Progress Notes (Signed)
Complete physical exam   Patient: Tanya Adams   DOB: 1970-03-02   50 y.o. Female  MRN: [id number] Visit Date: 04/04/2020  Today's healthcare provider: Lavon Paganini, MD   Chief Complaint  Patient presents with  . Annual Exam   Subjective    Tanya Adams is a 50 y.o. female who presents today for a complete physical exam.  She reports consuming a general diet.  She generally feels well. She reports sleeping well. She does not have additional problems to discuss today.      Past Medical History:  Diagnosis Date  . Allergic rhinitis   . Anemia   . Asthma   . Dysmenorrhea   . History of miscarriage   . Vitamin D deficiency    Past Surgical History:  Procedure Laterality Date  . BREAST CYST ASPIRATION Right   . CESAREAN SECTION    . KNEE ARTHROSCOPY    . NECK EXPLORATION    . PARATHYROIDECTOMY    . TUBAL LIGATION     Social History   Socioeconomic History  . Marital status: Married    Spouse name: Not on file  . Number of children: 2  . Years of education: Not on file  . Highest education level: Doctorate  Occupational History  . Occupation: PHYSICIAN    Employer: [employer]  Tobacco Use  . Smoking status: Never Smoker  . Smokeless tobacco: Never Used  Vaping Use  . Vaping Use: Never used  Substance and Sexual Activity  . Alcohol use: Yes    Alcohol/week: 0.0 standard drinks    Comment: seldom  . Drug use: No  . Sexual activity: Yes    Birth control/protection: Surgical, I.U.D.  Other Topics Concern  . Not on file  Social History Narrative  . Not on file   Social Determinants of Health   Financial Resource Strain: Not on file  Food Insecurity: Not on file  Transportation Needs: Not on file  Physical Activity: Not on file  Stress: Not on file  Social Connections: Not on file  Intimate Partner Violence: Not on file   Family Status  Relation Name Status  . Mother  Alive  . Father  Alive  . Brother  Alive  .  Neg Hx  (Not Specified)   Family History  Problem Relation Age of Onset  . Hypertension Mother   . Diabetes Mother   . Depression Mother   . Diabetes Father   . Benign prostatic hyperplasia Father   . Heart disease Father   . Healthy Brother   . Breast cancer Neg Hx   . Colon cancer Neg Hx   . Ovarian cancer Neg Hx    Allergies  Allergen Reactions  . Shellfish Allergy Anaphylaxis  . Tape Rash    Paper tape is OK    Patient Care Team: Virginia Crews, MD as PCP - General (Family Medicine)   Medications: Outpatient Medications Prior to Visit  Medication Sig  . albuterol (VENTOLIN HFA) 108 (90 Base) MCG/ACT inhaler Inhale 2 puffs into the lungs every 6 (six) hours as needed for wheezing or shortness of breath.  . benzonatate (TESSALON) 100 MG capsule Take 1 capsule (100 mg total) by mouth 2 (two) times daily as needed for cough.  . Cholecalciferol (VITAMIN D) 2000 units CAPS Take by mouth.  . Continuous Blood Gluc Sensor (FREESTYLE LIBRE 14 DAY SENSOR) MISC 1 each by Does not apply route every 14 (fourteen) days.  Marland Kitchen  fluticasone (FLONASE) 50 MCG/ACT nasal spray Place into the nose.  Marland Kitchen glucose blood (CONTOUR NEXT TEST) test strip Use as instructed to test blood glucose 1-3 times daily  . levonorgestrel (MIRENA) 20 MCG/24HR IUD 1 each by Intrauterine route once.  . loratadine (CLARITIN) 10 MG tablet Take 10 mg by mouth daily.  . meloxicam (MOBIC) 15 MG tablet Take 1 tablet (15 mg total) by mouth daily.  . montelukast (SINGULAIR) 10 MG tablet Take 1 tablet (10 mg total) by mouth daily.  . Peak Flow Meter DEVI Use to monitor peak flow daily  . tretinoin (RETIN-A) 0.05 % cream Apply topically at bedtime.   No facility-administered medications prior to visit.    Review of Systems  All other systems reviewed and are negative.     Objective    BP 104/69   Pulse 62   Temp 98.6 F (37 C)   Resp 16   Ht 5' 3.5" (1.613 m)   Wt 120 lb 8 oz (54.7 kg)   BMI 21.01 kg/m     Physical Exam Vitals reviewed.  Constitutional:      General: She is not in acute distress.    Appearance: Normal appearance. She is well-developed. She is not diaphoretic.  HENT:     Head: Normocephalic and atraumatic.     Right Ear: Tympanic membrane, ear canal and external ear normal.     Left Ear: Tympanic membrane, ear canal and external ear normal.     Nose: Nose normal.     Mouth/Throat:     Mouth: Mucous membranes are moist.     Pharynx: Oropharynx is clear. No oropharyngeal exudate.  Eyes:     General: No scleral icterus.    Conjunctiva/sclera: Conjunctivae normal.     Pupils: Pupils are equal, round, and reactive to light.  Neck:     Thyroid: No thyromegaly.  Cardiovascular:     Rate and Rhythm: Normal rate and regular rhythm.     Pulses: Normal pulses.     Heart sounds: Normal heart sounds. No murmur heard.   Pulmonary:     Effort: Pulmonary effort is normal. No respiratory distress.     Breath sounds: Normal breath sounds. No wheezing or rales.  Abdominal:     General: There is no distension.     Palpations: Abdomen is soft.     Tenderness: There is no abdominal tenderness.  Musculoskeletal:        General: No deformity.     Cervical back: Neck supple.     Right lower leg: No edema.     Left lower leg: No edema.  Lymphadenopathy:     Cervical: No cervical adenopathy.  Skin:    General: Skin is warm and dry.     Findings: No rash.  Neurological:     Mental Status: She is alert and oriented to person, place, and time. Mental status is at baseline.     Gait: Gait normal.  Psychiatric:        Mood and Affect: Mood normal.        Behavior: Behavior normal.        Thought Content: Thought content normal.      Last depression screening scores PHQ 2/9 Scores 04/04/2020 03/16/2019 03/11/2017  PHQ - 2 Score 0 0 0  PHQ- 9 Score - 0 -   Last fall risk screening Fall Risk  03/16/2019  Falls in the past year? 0  Number falls in past yr: 0  Injury with  Fall?  0  Risk for fall due to : No Fall Risks  Follow up Falls evaluation completed   Last Audit-C alcohol use screening Alcohol Use Disorder Test (AUDIT) 04/04/2020  1. How often do you have a drink containing alcohol? 2  2. How many drinks containing alcohol do you have on a typical day when you are drinking? 0  3. How often do you have six or more drinks on one occasion? 0  AUDIT-C Score 2  Alcohol Brief Interventions/Follow-up AUDIT Score <7 follow-up not indicated   A score of 3 or more in women, and 4 or more in men indicates increased risk for alcohol abuse, EXCEPT if all of the points are from question 1   No results found for any visits on 04/04/20.  Assessment & Plan    Routine Health Maintenance and Physical Exam  Exercise Activities and Dietary recommendations Goals   None     Immunization History  Administered Date(s) Administered  . Hepatitis B 03/17/1992, 04/17/1992, 09/22/1992  . Influenza,inj,Quad PF,6+ Mos 10/06/2016, 10/18/2017  . Influenza-Unspecified 10/02/2014, 10/18/2018, 10/23/2019  . MMR 09/21/1996, 08/08/2010  . PFIZER(Purple Top)SARS-COV-2 Vaccination 01/22/2019, 02/17/2019, 10/27/2019  . Pneumococcal Polysaccharide-23 10/18/2017  . Td 09/21/1996  . Tdap 04/26/2007, 11/23/2018  . Varicella 09/21/1996  . Yellow Fever 05/07/2016    Health Maintenance  Topic Date Due  . Hepatitis C Screening  Never done  . HIV Screening  Never done  . PAP SMEAR-Modifier  02/25/2023  . COLONOSCOPY (Pts 45-36yr Insurance coverage will need to be confirmed)  03/18/2024  . TETANUS/TDAP  11/22/2028  . INFLUENZA VACCINE  Completed  . COVID-19 Vaccine  Completed  . HPV VACCINES  Aged Out    Discussed health benefits of physical activity, and encouraged her to engage in regular exercise appropriate for her age and condition.   Return in about 6 months (around 10/05/2020) for chronic disease f/u.     I, ALavon Paganini MD, have reviewed all documentation for this  visit. The documentation on 04/04/20 for the exam, diagnosis, procedures, and orders are all accurate and complete.   [place], ADionne Bucy MD, MPH [facility]

## 2020-04-10 ENCOUNTER — Encounter: Payer: Self-pay | Admitting: Family Medicine

## 2020-04-10 LAB — COMPREHENSIVE METABOLIC PANEL
ALT: 34 IU/L — ABNORMAL HIGH (ref 0–32)
AST: 35 IU/L (ref 0–40)
Albumin/Globulin Ratio: 2 (ref 1.2–2.2)
Albumin: 4.8 g/dL (ref 3.8–4.8)
Alkaline Phosphatase: 53 IU/L (ref 44–121)
BUN/Creatinine Ratio: 23 (ref 9–23)
BUN: 16 mg/dL (ref 6–24)
Bilirubin Total: 0.4 mg/dL (ref 0.0–1.2)
CO2: 22 mmol/L (ref 20–29)
Calcium: 9.3 mg/dL (ref 8.7–10.2)
Chloride: 102 mmol/L (ref 96–106)
Creatinine, Ser: 0.7 mg/dL (ref 0.57–1.00)
Globulin, Total: 2.4 g/dL (ref 1.5–4.5)
Glucose: 124 mg/dL — ABNORMAL HIGH (ref 65–99)
Potassium: 4 mmol/L (ref 3.5–5.2)
Sodium: 141 mmol/L (ref 134–144)
Total Protein: 7.2 g/dL (ref 6.0–8.5)
eGFR: 106 mL/min/{1.73_m2} (ref >59)

## 2020-04-10 LAB — GLUTAMIC ACID DECARBOXYLASE AUTO ABS: Glutamic Acid Decarb Ab: 13.8 U/mL — ABNORMAL HIGH (ref 0.0–5.0)

## 2020-04-10 LAB — LIPID PANEL
Chol/HDL Ratio: 2.8 ratio (ref 0.0–4.4)
Cholesterol, Total: 194 mg/dL (ref 100–199)
HDL: 69 mg/dL (ref >39)
LDL Chol Calc (NIH): 117 mg/dL — ABNORMAL HIGH (ref 0–99)
Triglycerides: 41 mg/dL (ref 0–149)
VLDL Cholesterol Cal: 8 mg/dL (ref 5–40)

## 2020-04-10 LAB — TSH: TSH: 1.55 u[IU]/mL (ref 0.450–4.500)

## 2020-04-10 LAB — CBC WITH DIFFERENTIAL/PLATELET
Basophils Absolute: 0 10*3/uL (ref 0.0–0.2)
Basos: 0 %
EOS (ABSOLUTE): 0.1 10*3/uL (ref 0.0–0.4)
Eos: 1 %
Hematocrit: 43.3 % (ref 34.0–46.6)
Hemoglobin: 14.6 g/dL (ref 11.1–15.9)
Immature Grans (Abs): 0 10*3/uL (ref 0.0–0.1)
Immature Granulocytes: 0 %
Lymphocytes Absolute: 2 10*3/uL (ref 0.7–3.1)
Lymphs: 41 %
MCH: 30.4 pg (ref 26.6–33.0)
MCHC: 33.7 g/dL (ref 31.5–35.7)
MCV: 90 fL (ref 79–97)
Monocytes Absolute: 0.6 10*3/uL (ref 0.1–0.9)
Monocytes: 12 %
Neutrophils Absolute: 2.2 10*3/uL (ref 1.4–7.0)
Neutrophils: 46 %
Platelets: 162 10*3/uL (ref 150–450)
RBC: 4.81 x10E6/uL (ref 3.77–5.28)
RDW: 11.7 % (ref 11.7–15.4)
WBC: 4.9 10*3/uL (ref 3.4–10.8)

## 2020-04-10 LAB — VITAMIN D 25 HYDROXY (VIT D DEFICIENCY, FRACTURES): Vit D, 25-Hydroxy: 47.9 ng/mL (ref 30.0–100.0)

## 2020-04-10 LAB — INSULIN AND C-PEPTIDE, SERUM
C-Peptide: 1 ng/mL — ABNORMAL LOW (ref 1.1–4.4)
INSULIN: 3.5 u[IU]/mL (ref 2.6–24.9)

## 2020-04-10 LAB — HEMOGLOBIN A1C
Est. average glucose Bld gHb Est-mCnc: 137 mg/dL
Hgb A1c MFr Bld: 6.4 % — ABNORMAL HIGH (ref 4.8–5.6)

## 2020-04-10 LAB — HEPATITIS C ANTIBODY: Hep C Virus Ab: <0.1 s/co ratio (ref 0.0–0.9)

## 2020-04-22 ENCOUNTER — Encounter: Payer: Self-pay | Admitting: Family Medicine

## 2020-04-29 ENCOUNTER — Other Ambulatory Visit: Payer: Self-pay

## 2020-04-29 ENCOUNTER — Ambulatory Visit
Admission: RE | Admit: 2020-04-29 | Discharge: 2020-04-29 | Disposition: A | Discharge status: Home / Self Care | Payer: No Typology Code available for payment source | Source: Ambulatory Visit | Attending: Family Medicine | Admitting: Family Medicine

## 2020-04-29 DIAGNOSIS — Z1231 Encounter for screening mammogram for malignant neoplasm of breast: Secondary | ICD-10-CM | POA: Insufficient documentation

## 2020-05-16 ENCOUNTER — Ambulatory Visit (INDEPENDENT_AMBULATORY_CARE_PROVIDER_SITE_OTHER): Payer: No Typology Code available for payment source | Admitting: Obstetrics and Gynecology

## 2020-05-16 ENCOUNTER — Other Ambulatory Visit: Payer: Self-pay

## 2020-05-16 DIAGNOSIS — Z30432 Encounter for removal of intrauterine contraceptive device: Secondary | ICD-10-CM

## 2020-05-16 DIAGNOSIS — Z30431 Encounter for routine checking of intrauterine contraceptive device: Secondary | ICD-10-CM

## 2020-05-16 NOTE — Progress Notes (Signed)
Obstetrics & Gynecology Office Visit   Chief Complaint: No chief complaint on file.   History of Present Illness: 50 y.o. patient presenting for follow up of Mirena IUD placement 4 years ago.  The indication for her IUD was cycle control.  She admits to increased pain and cramping.  Still having some occasional spotting.  is not able to feel strings, and string were not visualized at the time of her last annual exam.    Review of Systems: Review of Systems  Constitutional: Negative.   Gastrointestinal: Positive for abdominal pain.  Genitourinary: Negative.     Past Medical History:  Past Medical History:  Diagnosis Date  . Allergic rhinitis   . Anemia   . Asthma   . Dysmenorrhea   . History of miscarriage   . Vitamin D deficiency     Past Surgical History:  Past Surgical History:  Procedure Laterality Date  . BREAST CYST ASPIRATION Right   . CESAREAN SECTION    . KNEE ARTHROSCOPY    . NECK EXPLORATION    . PARATHYROIDECTOMY    . TUBAL LIGATION      Gynecologic History: No LMP recorded. (Menstrual status: IUD).  Obstetric History: [id number]  Family History:  Family History  Problem Relation Age of Onset  . Hypertension Mother   . Diabetes Mother   . Depression Mother   . Diabetes Father   . Benign prostatic hyperplasia Father   . Heart disease Father   . Healthy Brother   . Breast cancer Neg Hx   . Colon cancer Neg Hx   . Ovarian cancer Neg Hx     Social History:  Social History   Socioeconomic History  . Marital status: Married    Spouse name: Not on file  . Number of children: 2  . Years of education: Not on file  . Highest education level: Doctorate  Occupational History  . Occupation: PHYSICIAN    Employer: [employer]  Tobacco Use  . Smoking status: Never Smoker  . Smokeless tobacco: Never Used  Vaping Use  . Vaping Use: Never used  Substance and Sexual Activity  . Alcohol use: Yes    Alcohol/week: 0.0 standard drinks     Comment: seldom  . Drug use: No  . Sexual activity: Yes    Birth control/protection: Surgical, I.U.D.  Other Topics Concern  . Not on file  Social History Narrative  . Not on file   Social Determinants of Health   Financial Resource Strain: Not on file  Food Insecurity: Not on file  Transportation Needs: Not on file  Physical Activity: Not on file  Stress: Not on file  Social Connections: Not on file  Intimate Partner Violence: Not on file    Allergies:  Allergies  Allergen Reactions  . Shellfish Allergy Anaphylaxis  . Tape Rash    Paper tape is OK    Medications: Prior to Admission medications   Medication Sig Start Date End Date Taking? Authorizing Provider  albuterol (VENTOLIN HFA) 108 (90 Base) MCG/ACT inhaler Inhale 2 puffs into the lungs every 6 (six) hours as needed for wheezing or shortness of breath.    [provider]  benzonatate (TESSALON) 100 MG capsule Take 1 capsule (100 mg total) by mouth 2 (two) times daily as needed for cough. 09/21/19   Virginia Crews, MD  Cholecalciferol (VITAMIN D) 2000 units CAPS Take by mouth.    [provider]  Continuous Blood Gluc Sensor (FREESTYLE  LIBRE 14 DAY SENSOR) MISC 1 each by Does not apply route every 14 (fourteen) days. 03/17/18   Virginia Crews, MD  fluticasone (FLONASE) 50 MCG/ACT nasal spray Place into the nose. 04/19/14   [provider]  glucose blood (CONTOUR NEXT TEST) test strip Use as instructed to test blood glucose 1-3 times daily 11/22/18   Virginia Crews, MD  levonorgestrel (MIRENA) 20 MCG/24HR IUD 1 each by Intrauterine route once.    [provider]  loratadine (CLARITIN) 10 MG tablet Take 10 mg by mouth daily.    [provider]  meloxicam (MOBIC) 15 MG tablet Take 1 tablet (15 mg total) by mouth daily. 09/21/19   Virginia Crews, MD  montelukast (SINGULAIR) 10 MG tablet Take 1 tablet (10 mg total) by mouth daily. 09/21/19   Virginia Crews,  MD  Peak Flow Meter DEVI Use to monitor peak flow daily 03/10/18   Virginia Crews, MD  tretinoin (RETIN-A) 0.05 % cream Apply topically at bedtime. 09/21/19   Virginia Crews, MD    Physical Exam There were no vitals taken for this visit. No LMP recorded. (Menstrual status: IUD).  General: NAD HEENT: normocephalic, anicteric Pulmonary: No increased work of breathing Genitourinary:  External: Normal external female genitalia.  Normal urethral meatus, normal  Bartholin's and Skene's glands.    Vagina: Normal vaginal mucosa, no evidence of prolapse.    Cervix: Grossly normal in appearance, no bleeding, IUD strings not visualized  Uterus: Non-enlarged, mobile, normal contour.  No CMT  Adnexa: ovaries non-enlarged, no adnexal masses  Rectal: deferred  Lymphatic: no evidence of inguinal lymphadenopathy Extremities: no edema, erythema, or tenderness Neurologic: Grossly intact Psychiatric: mood appropriate, affect full  Female chaperone present for pelvic and breast  portions of the physical exam  TVUS: normal uterus, no evidence of uterine fibroids noted.  The body of the IUD is sitting in the lower uterine segment internal cervical os with arms tucked.     GYNECOLOGY OFFICE PROCEDURE NOTE  Tanya Adams is a 50 y.o. [id number] here for Mirena IUD removal placed 2018. She desires removal secondary to malposition..  IUD Removal  Patient identified, informed consent performed, consent signed.  Patient was in the dorsal lithotomy position, normal external genitalia was noted.  A speculum was placed in the patient's vagina, normal discharge was noted, no lesions. The cervix was visualized, no lesions, no abnormal discharge.  The strings of the IUD were not visualized, so Kelly forceps were introduced into the cervix to try and grasped the IUD string after using a pap brush to sweep the strings down.  This was unsuccessful.  An IUD retrieval hood was then used to bring the string down  sufficiently enough to grab them with Kelly forceps.   The strings of the IUD were grasped and it was successfully removed in its entirety.  Patient tolerated the procedure well.  Attempt was made to sound the uterus following the procedure but sounding length was only 5cm before meeting resistance.  In order to prevent IUD from being placed in a false tract procedure aborted.    Assessment: 50 y.o. [id number] follow up IUD location, IUD removal  Plan: Problem List Items Addressed This Visit   None   Visit Diagnoses    IUD check up    -  Primary   Encounter for IUD removal           1.  IUD removed today as noted to be in lower  uterine segment/cervix on ultrasound.    2.  Unable to replace IUD as unable to obtain adequate sounding length.  Discussed attempt to replace IUD in the next 2 weeks vs expectant management.  At present patient opts to proceed with expectant management.  She is status post prior BTL for contraception.  If menses return heavy or irregular will plan on replacing IUD.  In addition we discussed ability to check FSH/estradiol levels but will hold off at present.    3.  She will return for a annual exam in 1 year.  All questions answered.  4) A total of 15 minutes were spent in face-to-face contact with the patient during this encounter with over half of that time devoted to counseling and coordination of care.  5) Return in about 1 year (around 05/16/2021), or if symptoms worsen or fail to improve, for annual.   Malachy Mood, MD, [place], [place] 05/16/2020, 2:00 PM

## 2020-06-12 ENCOUNTER — Encounter: Payer: Self-pay | Admitting: Family Medicine

## 2020-06-12 DIAGNOSIS — R768 Other specified abnormal immunological findings in serum: Secondary | ICD-10-CM

## 2020-06-12 DIAGNOSIS — R7303 Prediabetes: Secondary | ICD-10-CM

## 2020-06-26 ENCOUNTER — Other Ambulatory Visit (INDEPENDENT_AMBULATORY_CARE_PROVIDER_SITE_OTHER): Payer: No Typology Code available for payment source

## 2020-06-26 ENCOUNTER — Other Ambulatory Visit: Payer: Self-pay

## 2020-06-26 ENCOUNTER — Encounter: Payer: Self-pay | Admitting: Family Medicine

## 2020-06-26 DIAGNOSIS — E139 Other specified diabetes mellitus without complications: Secondary | ICD-10-CM

## 2020-06-26 LAB — POCT GLYCOSYLATED HEMOGLOBIN (HGB A1C): Hemoglobin A1C: 6.4 % — AB (ref 4.0–5.6)

## 2020-06-26 LAB — POCT UA - MICROALBUMIN: Microalbumin Ur, POC: 20 mg/L

## 2020-06-26 MED ORDER — TRESIBA FLEXTOUCH 100 UNIT/ML ~~LOC~~ SOPN
PEN_INJECTOR | SUBCUTANEOUS | 6 refills | Status: DC
  Filled 2020-06-26: qty 15, 30d supply, fill #0

## 2020-06-26 MED ORDER — UNIFINE PENTIPS 32G X 4 MM MISC
4 refills | Status: DC
Start: 2020-06-26 — End: 2021-09-22
  Filled 2020-06-26 – 2020-06-28 (×2): qty 100, 90d supply, fill #0

## 2020-06-26 MED ORDER — DEXCOM G6 TRANSMITTER MISC
3 refills | Status: DC
  Filled 2020-08-19: qty 1, 1d supply, fill #0
  Filled 2021-03-25: qty 1, 90d supply, fill #0

## 2020-06-26 MED ORDER — DEXCOM G6 SENSOR MISC
12 refills | Status: DC
  Filled 2020-08-19: qty 3, 30d supply, fill #0
  Filled 2020-09-25: qty 9, 90d supply, fill #0
  Filled 2020-11-25: qty 9, 90d supply, fill #1

## 2020-06-26 NOTE — Progress Notes (Signed)
Patient stopped by the office for a urine micro albumin check and POCT A1C.

## 2020-06-28 ENCOUNTER — Other Ambulatory Visit: Payer: Self-pay

## 2020-07-03 ENCOUNTER — Other Ambulatory Visit: Payer: Self-pay

## 2020-07-11 DIAGNOSIS — E139 Other specified diabetes mellitus without complications: Secondary | ICD-10-CM | POA: Insufficient documentation

## 2020-07-30 ENCOUNTER — Other Ambulatory Visit (HOSPITAL_COMMUNITY): Payer: Self-pay

## 2020-07-31 ENCOUNTER — Other Ambulatory Visit: Payer: Self-pay

## 2020-08-01 ENCOUNTER — Other Ambulatory Visit: Payer: Self-pay

## 2020-08-01 MED ORDER — FREESTYLE LITE TEST VI STRP
ORAL_STRIP | 11 refills | Status: DC
  Filled 2020-08-01: qty 100, 30d supply, fill #0
  Filled 2020-10-08: qty 100, 30d supply, fill #1
  Filled 2021-04-03: qty 100, 30d supply, fill #2

## 2020-08-19 ENCOUNTER — Other Ambulatory Visit: Payer: Self-pay

## 2020-08-20 ENCOUNTER — Other Ambulatory Visit: Payer: Self-pay

## 2020-08-20 MED ORDER — DEXCOM G6 TRANSMITTER MISC: 3 refills | Status: DC

## 2020-08-20 MED ORDER — DEXCOM G6 SENSOR MISC
12 refills | Status: DC
  Filled 2020-08-20 – 2020-08-27 (×2): qty 3, 30d supply, fill #0
  Filled 2020-12-17 – 2020-12-19 (×2): qty 3, 30d supply, fill #1
  Filled 2021-01-31: qty 9, 90d supply, fill #2

## 2020-08-27 ENCOUNTER — Other Ambulatory Visit: Payer: Self-pay

## 2020-08-28 ENCOUNTER — Other Ambulatory Visit: Payer: Self-pay

## 2020-09-06 ENCOUNTER — Other Ambulatory Visit: Payer: Self-pay

## 2020-09-09 ENCOUNTER — Other Ambulatory Visit: Payer: Self-pay

## 2020-09-10 ENCOUNTER — Encounter: Payer: Self-pay | Admitting: Family Medicine

## 2020-09-10 DIAGNOSIS — R1032 Left lower quadrant pain: Secondary | ICD-10-CM

## 2020-09-11 ENCOUNTER — Other Ambulatory Visit: Payer: Self-pay

## 2020-09-11 ENCOUNTER — Ambulatory Visit
Admission: RE | Admit: 2020-09-11 | Discharge: 2020-09-11 | Disposition: A | Discharge status: Home / Self Care | Payer: No Typology Code available for payment source | Attending: Family Medicine | Admitting: Family Medicine

## 2020-09-11 ENCOUNTER — Ambulatory Visit
Admission: RE | Admit: 2020-09-11 | Discharge: 2020-09-11 | Disposition: A | Discharge status: Home / Self Care | Payer: No Typology Code available for payment source | Source: Ambulatory Visit | Attending: Family Medicine | Admitting: Family Medicine

## 2020-09-11 ENCOUNTER — Telehealth: Payer: Self-pay

## 2020-09-11 DIAGNOSIS — R1032 Left lower quadrant pain: Secondary | ICD-10-CM | POA: Insufficient documentation

## 2020-09-11 NOTE — Telephone Encounter (Signed)
Copied from [place] [id number]. Topic: General - Inquiry >> Sep 11, 2020 12:37 PM Oneta Rack wrote: Osvaldo Human name: Tiffany  Relation to pt: from [place]  Call back number: [phone]   Reason for call:  Reached out to the backdoor line at [phone] and no answer. Patient is currently at imaging and the orders placed by Dr. Zigmund Daniel were not signed. Requesting a call back

## 2020-09-12 ENCOUNTER — Encounter: Payer: Self-pay | Admitting: Family Medicine

## 2020-09-12 ENCOUNTER — Ambulatory Visit (INDEPENDENT_AMBULATORY_CARE_PROVIDER_SITE_OTHER): Payer: No Typology Code available for payment source | Admitting: Family Medicine

## 2020-09-12 VITALS — BP 102/68 | HR 60 | Temp 98.0°F | Ht 63.5 in | Wt 121.0 lb

## 2020-09-12 DIAGNOSIS — M84350A Stress fracture, pelvis, initial encounter for fracture: Secondary | ICD-10-CM | POA: Diagnosis not present

## 2020-09-12 DIAGNOSIS — R1032 Left lower quadrant pain: Secondary | ICD-10-CM | POA: Diagnosis not present

## 2020-09-12 NOTE — Telephone Encounter (Signed)
Spoke with Jonelle Sidle, X-Ray was signed yesterday.  Nothing further needed.

## 2020-09-12 NOTE — Assessment & Plan Note (Addendum)
Patient is a longtime avid running athlete currently training for [employer], upcoming on October 9.  Last week during her routine training while running she noted sharp left groin pain, she localizes the pain to the pubic symphysis region.  She was able to finish out the run, has noted intermittent bouts of dull ache and did experience an episode of inability to walk after getting out of her car last week.  Pain does not radiate, can be aggravated by sitting to standing at times though does have a sporadic element.  Denies any similar symptoms in the past as she routinely experiences right-sided hip and knee pain, citing IT band and intra-articular left knee issues.  In regards to specific factors, she does relay new running shoes during that run, a contracted training regimen with less days for rest, but otherwise denies progressively worsening pain with decreasing levels of activity, no consistent pain with ADLs.  Physical examination reveals full range of motion at bilateral hips, FADIR testing on the left hip does elicit groin pain, Ober's test is positive but does not elicit her presenting symptoms, straight leg raise, Faber, psoas, and hip flexor testing benign.  Patient denies any tenderness with direct palpation over the symphysis.  Resisted adduction does elicit minor pain throughout the left adductor mid-proximal region.  Patient's constellation of symptoms in the setting of reassuring x-rays raises concern for stress injury to the pelvis and/or left proximal femur, additional differential to include labral pathology, adductor strain.  I have reviewed the same with the patient as well as treatment strategy, she will continue conditioning with closed chain activities, hold from running, walking/jogging can be continued if symptoms are not reproduced, analgesics to be reserved for symptoms that disrupt sleep, and we will proceed with stat MRI of the pelvis and left hip.  If stress reaction or  fracture noted, activity shutdown and specific guidance to be provided.  Otherwise, differential can be reevaluated pending results and patient can possibly return to running.  Patient is understanding amenable to this plan moving forward.

## 2020-09-12 NOTE — Progress Notes (Signed)
Primary Care / Sports Medicine Office Visit  Patient Information:  Patient ID: Tanya Adams, female DOB: 01-06-1971 Age: 50 y.o. MRN: [id number]   Tanya Adams is a pleasant 50 y.o. female presenting with the following:  Chief Complaint  Patient presents with   New Patient (Initial Visit)   Groin Pain    Left; X-Ray 09/11/20; started on 08/31/20; patient is training for a marathon; sharp, dull, ache intermittent; sitting and standing up exacerbates pain; 2-10/10 pain    Review of Systems pertinent details above   Patient Active Problem List   Diagnosis Date Noted   Stress fx pelvis 09/12/2020   Left groin pain 09/12/2020   Latent autoimmune diabetes in adults (LADA), managed as type 1 ([place]) 07/11/2020   Mass of upper outer quadrant of left breast 07/08/2018   Prediabetes 03/10/2018   Autoantibody titer elevated 03/10/2018   Asthma exacerbation 04/03/2015   Asthma with allergic rhinitis 02/15/2015   Esophagitis, reflux 02/15/2015   H/O parathyroidectomy 02/15/2015   H/O tubal ligation 02/15/2015   Avitaminosis D 02/15/2015   Allergic rhinitis 07/31/2014   History of anemia 02/22/2014   Past Medical History:  Diagnosis Date   Allergic rhinitis    Anemia    Asthma    Dysmenorrhea    History of miscarriage    Vitamin D deficiency    Outpatient Encounter Medications as of 09/12/2020  Medication Sig   albuterol (VENTOLIN HFA) 108 (90 Base) MCG/ACT inhaler Inhale 2 puffs into the lungs every 6 (six) hours as needed for wheezing or shortness of breath.   benzonatate (TESSALON) 100 MG capsule Take 1 capsule (100 mg total) by mouth 2 (two) times daily as needed for cough.   Cholecalciferol (VITAMIN D) 2000 units CAPS Take by mouth.   Continuous Blood Gluc Sensor (DEXCOM G6 SENSOR) MISC Use to monitor blood sugar.  Replace every 10 days   Continuous Blood Gluc Sensor (DEXCOM G6 SENSOR) MISC Use to monitor blood sugar.  Replace every 10 days   Continuous Blood Gluc  Transmit (DEXCOM G6 TRANSMITTER) MISC Use to monitor blood sugar.  Replace every 3 months   Continuous Blood Gluc Transmit (DEXCOM G6 TRANSMITTER) MISC Use to monitor blood sugar.  Replace every 3 months   fluticasone (FLONASE) 50 MCG/ACT nasal spray Place 1 spray into both nostrils daily.   glucose blood (CONTOUR NEXT TEST) test strip Use as instructed to test blood glucose 1-3 times daily   glucose blood (FREESTYLE LITE) test strip Use to test blood sugar 1 to 3 times daily   insulin degludec (TRESIBA FLEXTOUCH) 100 UNIT/ML FlexTouch Pen Inject 5 Units subcutaneously once daily .  Titrate based on your sugar to a max daily dose of 50 units.   Insulin Pen Needle (UNIFINE PENTIPS) 32G X 4 MM MISC use daily as directed   loratadine (CLARITIN) 10 MG tablet Take 10 mg by mouth daily.   meloxicam (MOBIC) 15 MG tablet Take 1 tablet (15 mg total) by mouth daily.   montelukast (SINGULAIR) 10 MG tablet Take 1 tablet (10 mg total) by mouth daily.   Peak Flow Meter DEVI Use to monitor peak flow daily   tretinoin (RETIN-A) 0.05 % cream Apply topically at bedtime.   [DISCONTINUED] Continuous Blood Gluc Sensor (FREESTYLE LIBRE 14 DAY SENSOR) MISC 1 each by Does not apply route every 14 (fourteen) days.   [DISCONTINUED] levonorgestrel (MIRENA) 20 MCG/24HR IUD 1 each by Intrauterine route once.   No facility-administered encounter medications on  file as of 09/12/2020.   Past Surgical History:  Procedure Laterality Date   BREAST CYST ASPIRATION Right    CESAREAN SECTION     KNEE ARTHROSCOPY     NECK EXPLORATION     PARATHYROIDECTOMY     TUBAL LIGATION      Vitals:   09/12/20 1327  BP: 102/68  Pulse: 60  Temp: 98 F (36.7 C)  SpO2: 99%   Vitals:   09/12/20 1327  Weight: 121 lb (54.9 kg)  Height: 5' 3.5" (1.613 m)   Body mass index is 21.1 kg/m.  No results found.   Independent interpretation of notes and tests performed by another provider:   Independent interpretation of left hip with  pelvis x-ray dated 09/11/2020 reveals maintained femoroacetabular joint space bilaterally, subtle opacity at the right acetabulum possibly consistent with osteophyte/loose body, no asymmetric sclerosis, minimal/trace narrowing at the inferior aspect of the sacroiliac junction bilaterally, limited visualization of the lumbar spine without significant degeneration, no acute osseous process identified.  Procedures performed:   None  Pertinent History, Exam, Impression, and Recommendations:   Stress fx pelvis Patient is a longtime avid running athlete currently training for [employer], upcoming on October 9.  Last week during her routine training while running she noted sharp left groin pain, she localizes the pain to the pubic symphysis region.  She was able to finish out the run, has noted intermittent bouts of dull ache and did experience an episode of inability to walk after getting out of her car last week.  Pain does not radiate, can be aggravated by sitting to standing at times though does have a sporadic element.  Denies any similar symptoms in the past as she routinely experiences right-sided hip and knee pain, citing IT band and intra-articular left knee issues.  In regards to specific factors, she does relay new running shoes during that run, a contracted training regimen with less days for rest, but otherwise denies progressively worsening pain with decreasing levels of activity, no consistent pain with ADLs.  Physical examination reveals full range of motion at bilateral hips, FADIR testing on the left hip does elicit groin pain, Ober's test is positive but does not elicit her presenting symptoms, straight leg raise, Faber, psoas, and hip flexor testing benign.  Patient denies any tenderness with direct palpation over the symphysis.  Resisted adduction does elicit minor pain throughout the left adductor mid-proximal region.  Patient's constellation of symptoms in the setting of  reassuring x-rays raises concern for stress injury to the pelvis and/or left proximal femur, additional differential to include labral pathology, adductor strain.  I have reviewed the same with the patient as well as treatment strategy, she will continue conditioning with closed chain activities, hold from running, walking/jogging can be continued if symptoms are not reproduced, analgesics to be reserved for symptoms that disrupt sleep, and we will proceed with stat MRI of the pelvis and left hip.  If stress reaction or fracture noted, activity shutdown and specific guidance to be provided.  Otherwise, differential can be reevaluated pending results and patient can possibly return to running.  Patient is understanding amenable to this plan moving forward.  Left groin pain See additional assessment(s) for plan details.    Orders & Medications No orders of the defined types were placed in this encounter.  Orders Placed This Encounter  Procedures   MR HIP LEFT WO CONTRAST   MR PELVIS WO CONTRAST     Return if symptoms worsen or fail  to improve.     Montel Culver, MD   Primary Care Sports Medicine [place]

## 2020-09-12 NOTE — Assessment & Plan Note (Signed)
See additional assessment(s) for plan details. 

## 2020-09-12 NOTE — Patient Instructions (Addendum)
-   Obtain MRI of pelvis and left hip once scheduled - Maintain primarily closed chain (spin, elliptical, etc.) activities to maintain endurance - Okay to perform walking/light jogging as long as you remain asymptomatic - Limit OTC analgesics to as needed if sleep is disrupted, do not use to mask pain - Our office will contact you with results once available and neck steps - Contact us for any questions between now and then

## 2020-09-13 ENCOUNTER — Ambulatory Visit
Admission: RE | Admit: 2020-09-13 | Discharge: 2020-09-13 | Disposition: A | Discharge status: Home / Self Care | Payer: No Typology Code available for payment source | Source: Ambulatory Visit | Attending: Family Medicine | Admitting: Family Medicine

## 2020-09-13 ENCOUNTER — Other Ambulatory Visit: Payer: Self-pay

## 2020-09-13 DIAGNOSIS — R1032 Left lower quadrant pain: Secondary | ICD-10-CM | POA: Insufficient documentation

## 2020-09-13 DIAGNOSIS — M84350A Stress fracture, pelvis, initial encounter for fracture: Secondary | ICD-10-CM | POA: Insufficient documentation

## 2020-09-13 NOTE — Progress Notes (Signed)
Incidentally noted fibroid uterus on recent imaging obtained for musculoskeletal concerns, FYI for your patient.

## 2020-09-13 NOTE — Progress Notes (Signed)
Per our phone discussion, the MRI results show osteitis pubis, consider on the lowest end of a continuum with stress reaction and stress fracture with treatment being comparable. You can return to running in a graded manner, ensure adequate rest / "active recovery" days where you cross-train, and you can use NSAIDs if symptomatic after activity. Ensure that your activity level and training regimen allows symptoms to be stable and further restrict if pain is progressively worsening. Otherwise, the condition is self-limited but may take several months to fully resolve. Please contact me for any issues you encounter and best of luck with the upcoming marathon.

## 2020-09-25 ENCOUNTER — Other Ambulatory Visit: Payer: Self-pay

## 2020-09-25 NOTE — Telephone Encounter (Signed)
If we could see if any provider has an opening for Dr. Drue Stager next Thursday.

## 2020-09-26 ENCOUNTER — Other Ambulatory Visit: Payer: Self-pay

## 2020-09-26 ENCOUNTER — Other Ambulatory Visit: Payer: Self-pay | Admitting: Obstetrics and Gynecology

## 2020-09-26 MED ORDER — NORETHINDRONE ACETATE 5 MG PO TABS
5.0000 mg | ORAL_TABLET | Freq: Every day | ORAL | 0 refills | Status: DC
  Filled 2020-09-26: qty 30, 30d supply, fill #0

## 2020-09-27 ENCOUNTER — Other Ambulatory Visit: Payer: Self-pay

## 2020-10-02 ENCOUNTER — Other Ambulatory Visit (HOSPITAL_COMMUNITY): Payer: Self-pay

## 2020-10-03 ENCOUNTER — Ambulatory Visit: Payer: No Typology Code available for payment source | Admitting: Obstetrics and Gynecology

## 2020-10-03 ENCOUNTER — Other Ambulatory Visit: Payer: Self-pay | Admitting: Family Medicine

## 2020-10-03 ENCOUNTER — Other Ambulatory Visit: Payer: Self-pay

## 2020-10-03 ENCOUNTER — Ambulatory Visit (INDEPENDENT_AMBULATORY_CARE_PROVIDER_SITE_OTHER): Payer: No Typology Code available for payment source | Admitting: Obstetrics and Gynecology

## 2020-10-03 DIAGNOSIS — N939 Abnormal uterine and vaginal bleeding, unspecified: Secondary | ICD-10-CM | POA: Diagnosis not present

## 2020-10-03 DIAGNOSIS — D251 Intramural leiomyoma of uterus: Secondary | ICD-10-CM

## 2020-10-03 DIAGNOSIS — D250 Submucous leiomyoma of uterus: Secondary | ICD-10-CM | POA: Diagnosis not present

## 2020-10-03 MED ORDER — SLYND 4 MG PO TABS: 84.0000 | ORAL_TABLET | Freq: Every day | ORAL | 3 refills | Status: DC

## 2020-10-03 NOTE — Progress Notes (Signed)
Gynecology Abnormal Uterine Bleeding Initial Evaluation   Chief Complaint: No chief complaint on file.   History of Present Illness:    Tanya Adams is a 50 y.o. [id number] who LMP was No LMP recorded. Patient is perimenopausal., presents today for a problem visit.  She complains of menorrhagia.  Has 3 months of amenorrhea following IUD removal but then had heavier bleeding this past month.  She was started on norethindrone 43m to stop bleeding which was successful.  She plan on running a marathon in the next month and we will keep that going.  Pap up to date 02/24/2018 NIL and HR HPV negative   Previous evaluation: MRI Pelvis 09/13/2020 Prior Diagnosis: Numerous small uterine fibroids largest 2.2cm intramural fundal as well as 2.2cm left anterior uterine body.  .  Review of Systems: Review of Systems  Constitutional: Negative.   Gastrointestinal: Negative.   Genitourinary: Negative.    Past Medical History:  Patient Active Problem List   Diagnosis Date Noted   Stress fx pelvis 09/12/2020   Left groin pain 09/12/2020   Latent autoimmune diabetes in adults (LADA), [place] 1 [place] 07/11[place]   Mass of upper outer quadrant of left breast 07/08/2018   Prediabetes 03/10/2018   Autoantibody titer elevated 03/10/2018   Asthma exacerbation 04/03/2015   Asthma with allergic rhinitis 02/15/2015   Esophagitis, reflux 02/15/2015   H/O parathyroidectomy 02/15/2015   H/O tubal ligation 02/15/2015   Avitaminosis D 02/15/2015   Allergic rhinitis 07/31/2014   History of anemia 02/22/2014    Past Surgical History:  Past Surgical History:  Procedure Laterality Date   BREAST CYST ASPIRATION Right    CESAREAN SECTION     KNEE ARTHROSCOPY     NECK EXPLORATION     PARATHYROIDECTOMY     TUBAL LIGATION      Obstetric History: [id number] Family History:  Family History  Problem Relation Age of Onset   Hypertension Mother    Diabetes Mother    Depression Mother    Diabetes Father     Benign prostatic hyperplasia Father    Heart disease Father    Healthy Brother    Breast cancer Neg Hx    Colon cancer Neg Hx    Ovarian cancer Neg Hx     Social History:  Social History   Socioeconomic History   Marital status: Married    Spouse name: DDealer  Number of children: 2   Years of education: 20   Highest education level: DOccupational hygienistHistory   Occupation: POceanographer [employer]  Tobacco Use   Smoking status: Never   Smokeless tobacco: Never  Vaping Use   Vaping Use: Never used  Substance and Sexual Activity   Alcohol use: Not Currently   Drug use: Never   Sexual activity: Yes    Partners: Male    Birth control/protection: Surgical, I.U.D.  Other Topics Concern   Not on file  Social History Narrative   Not on file   Social Determinants of Health   Financial Resource Strain: Not on file  Food Insecurity: Not on file  Transportation Needs: Not on file  Physical Activity: Not on file  Stress: Not on file  Social Connections: Not on file  Intimate Partner Violence: Not on file    Allergies:  Allergies  Allergen Reactions   Shellfish Allergy Anaphylaxis   Tape Rash    Paper tape is OK    Medications: Prior  to Admission medications   Medication Sig Start Date End Date Taking? Authorizing Provider  Drospirenone (SLYND) 4 MG TABS Take 84 tablets by mouth daily. 10/03/20  Yes Malachy Mood, MD  albuterol (VENTOLIN HFA) 108 (90 Base) MCG/ACT inhaler Inhale 2 puffs into the lungs every 6 (six) hours as needed for wheezing or shortness of breath.    [provider]  benzonatate (TESSALON) 100 MG capsule Take 1 capsule (100 mg total) by mouth 2 (two) times daily as needed for cough. 09/21/19   Virginia Crews, MD  Cholecalciferol (VITAMIN D) 2000 units CAPS Take by mouth.    [provider]  Continuous Blood Gluc Sensor (DEXCOM G6 SENSOR) MISC Use to monitor blood sugar.  Replace every 10  days 06/26/20     Continuous Blood Gluc Sensor (DEXCOM G6 SENSOR) MISC Use to monitor blood sugar.  Replace every 10 days 08/20/20     Continuous Blood Gluc Transmit (DEXCOM G6 TRANSMITTER) MISC Use to monitor blood sugar.  Replace every 3 months 06/26/20     Continuous Blood Gluc Transmit (DEXCOM G6 TRANSMITTER) MISC Use to monitor blood sugar.  Replace every 3 months 08/20/20     fluticasone (FLONASE) 50 MCG/ACT nasal spray Place 1 spray into both nostrils daily. 04/19/14   [provider]  glucose blood (CONTOUR NEXT TEST) test strip Use as instructed to test blood glucose 1-3 times daily 11/22/18   Virginia Crews, MD  glucose blood (FREESTYLE LITE) test strip Use to test blood sugar 1 to 3 times daily 08/01/20     insulin degludec (TRESIBA FLEXTOUCH) 100 UNIT/ML FlexTouch Pen Inject 5 Units subcutaneously once daily .  Titrate based on your sugar to a max daily dose of 50 units. 06/26/20     Insulin Pen Needle (UNIFINE PENTIPS) 32G X 4 MM MISC use daily as directed 06/26/20     loratadine (CLARITIN) 10 MG tablet Take 10 mg by mouth daily.    [provider]  meloxicam (MOBIC) 15 MG tablet Take 1 tablet (15 mg total) by mouth daily. 09/21/19   Virginia Crews, MD  montelukast (SINGULAIR) 10 MG tablet Take 1 tablet (10 mg total) by mouth daily. 09/21/19   Virginia Crews, MD  Peak Flow Meter DEVI Use to monitor peak flow daily 03/10/18   Virginia Crews, MD  tretinoin (RETIN-A) 0.05 % cream Apply topically at bedtime. 09/21/19   Virginia Crews, MD    Physical Exam There were no vitals taken for this visit.  No LMP recorded. Patient is perimenopausal.  General: NAD HEENT: normocephalic, anicteric Pulmonary: No increased work of breathing Genitourinary:  External: Normal external female genitalia.  Normal urethral meatus, normal Bartholin's and Skene's glands.    Vagina: Normal vaginal mucosa, no evidence of prolapse.    Cervix: Grossly normal in appearance, no  bleeding.  Sounding length of 5cm before meeting resistance.    Uterus: Non-enlarged, mobile, normal contour.  No CMT  Rectal: deferred  Lymphatic: no evidence of inguinal lymphadenopathy Extremities: no edema, erythema, or tenderness Neurologic: Grossly intact Psychiatric: mood appropriate, affect full  Female chaperone present for pelvic portions of the physical exam  TVUS was performed noting uterine length to be around 8.5cm the anterior fibroid was noted about 5cm from the cervical os and correlated with the sounding length at which resistance was met.    MR PELVIS WO CONTRAST  Result Date: 09/13/2020 CLINICAL DATA:  Hip pain, stress fracture suspected, neg xray; Pelvis pain, stress  fracture suspected, neg xray EXAM: MR OF THE LEFT HIP WITHOUT CONTRAST MR OF THE PELVIS WITHOUT CONTRAST TECHNIQUE: Multiplanar, multisequence MR imaging was performed. No intravenous contrast was administered. COMPARISON:  X-ray 09/11/2020 FINDINGS: Bones: Bone marrow edema within the parasymphyseal aspect of the bilateral pubic bones (series 9, images 29-33). No well-defined subchondral fracture line. No fluid signal seen undercutting the aponeurosis at the pubic symphysis. Mild arthropathy of the pubic symphysis. No effusion. No diastasis. Remaining bony structures of the pelvis and bilateral hips are intact without fracture, dislocation, marrow edema, or periostitis. No femoral head avascular necrosis. No significant arthropathy of the sacroiliac joints. Visualized lower lumbar spine within normal limits. No marrow replacing bone lesion. Articular cartilage and labrum Articular cartilage: No chondral defect. No subchondral marrow signal changes. Labrum: Possible nondisplaced anterosuperior labral tear (series 13, images 11-13). Evaluation is limited in the absence of intra-articular fluid or contrast. No paralabral cyst. Joint or bursal effusion Joint effusion:  None. Bursae: No abnormal bursal fluid collections.  Muscles and tendons Muscles and tendons: The gluteal, hamstring, iliopsoas, rectus femoris, and adductor tendons appear intact without tear or significant tendinosis. Normal muscle bulk and signal intensity without edema, atrophy, or fatty infiltration. Other findings Miscellaneous: There are numerous uterine fibroids, largest located at the uterine fundus measuring approximately 2.2 cm in diameter and also a left anterior uterine body fibroid measuring 2.2 cm. At least 1 fibroid in the posterior uterus appears partially submucosal (series 9, image 29). Trace free fluid within the pelvis, which may be physiologic. No soft tissue edema or fluid collection. No inguinal lymphadenopathy. IMPRESSION: 1. Bone marrow edema within the parasymphyseal aspect of the bilateral pubic bones. No well-defined subchondral fracture line. Findings are most consistent with osteitis pubis. 2. Suspected nondisplaced anterosuperior labral tear. 3. Otherwise unremarkable MRI of the left hip. 4. Fibroid uterus. Electronically Signed   By: Davina Poke D.O.   On: 09/13/2020 12:16   MR HIP LEFT WO CONTRAST  Result Date: 09/13/2020 CLINICAL DATA:  Hip pain, stress fracture suspected, neg xray; Pelvis pain, stress fracture suspected, neg xray EXAM: MR OF THE LEFT HIP WITHOUT CONTRAST MR OF THE PELVIS WITHOUT CONTRAST TECHNIQUE: Multiplanar, multisequence MR imaging was performed. No intravenous contrast was administered. COMPARISON:  X-ray 09/11/2020 FINDINGS: Bones: Bone marrow edema within the parasymphyseal aspect of the bilateral pubic bones (series 9, images 29-33). No well-defined subchondral fracture line. No fluid signal seen undercutting the aponeurosis at the pubic symphysis. Mild arthropathy of the pubic symphysis. No effusion. No diastasis. Remaining bony structures of the pelvis and bilateral hips are intact without fracture, dislocation, marrow edema, or periostitis. No femoral head avascular necrosis. No significant  arthropathy of the sacroiliac joints. Visualized lower lumbar spine within normal limits. No marrow replacing bone lesion. Articular cartilage and labrum Articular cartilage: No chondral defect. No subchondral marrow signal changes. Labrum: Possible nondisplaced anterosuperior labral tear (series 13, images 11-13). Evaluation is limited in the absence of intra-articular fluid or contrast. No paralabral cyst. Joint or bursal effusion Joint effusion:  None. Bursae: No abnormal bursal fluid collections. Muscles and tendons Muscles and tendons: The gluteal, hamstring, iliopsoas, rectus femoris, and adductor tendons appear intact without tear or significant tendinosis. Normal muscle bulk and signal intensity without edema, atrophy, or fatty infiltration. Other findings Miscellaneous: There are numerous uterine fibroids, largest located at the uterine fundus measuring approximately 2.2 cm in diameter and also a left anterior uterine body fibroid measuring 2.2 cm. At least 1 fibroid in the posterior uterus  appears partially submucosal (series 9, image 29). Trace free fluid within the pelvis, which may be physiologic. No soft tissue edema or fluid collection. No inguinal lymphadenopathy. IMPRESSION: 1. Bone marrow edema within the parasymphyseal aspect of the bilateral pubic bones. No well-defined subchondral fracture line. Findings are most consistent with osteitis pubis. 2. Suspected nondisplaced anterosuperior labral tear. 3. Otherwise unremarkable MRI of the left hip. 4. Fibroid uterus. Electronically Signed   By: Davina Poke D.O.   On: 09/13/2020 12:16   DG Hip Unilat W OR W/O Pelvis 2-3 Views Left  Result Date: 09/14/2020 CLINICAL DATA:  left groin pain, include pelvis view EXAM: DG HIP (WITH OR WITHOUT PELVIS) 2-3V LEFT COMPARISON:  None. FINDINGS: There is no evidence of hip fracture or dislocation. There is no evidence of arthropathy or other focal bone abnormality. IMPRESSION: Negative. Electronically  Signed   By: Jacqulynn Cadet M.D.   On: 09/14/2020 08:34    Assessment: 50 y.o. [id number] with abnormal uterine bleeding  Plan: Problem List Items Addressed This Visit   None Visit Diagnoses     Abnormal uterine bleeding    -  Primary   Intramural and submucous leiomyoma of uterus           1) AUB-L - several fibroids noted on recent MRI with at least one having a partial submucosal component - sounding length of 5cm, with ultrasound and MRI cavity length coming out to about 8.5cm.  On ultrasound 5cm is where the submucosal component of the fibroids start - will continue norethindrone 20m daily for now, then switch to slynd.  Depending on cycle control we discussed the option of Oriahnn or Myfembree - Given relatively small size of fibroid I do not think they would be easy to localize for [place]ut we discussed this as another intervention  2) Return if symptoms worsen or fail to improve.   AMalachy Mood MD, [facility], [facility] 10/03/2020, 3:22 PM

## 2020-10-03 NOTE — Telephone Encounter (Signed)
Requested medication (s) are due for refill today: Yes  Requested medication (s) are on the active medication list: Yes  Last refill:  09/21/19  Future visit scheduled: No  Notes to clinic:  Requesting more than 20 pills.    Requested Prescriptions  Pending Prescriptions Disp Refills   benzonatate (TESSALON) 100 MG capsule 20 capsule 0    Sig: Take 1 capsule (100 mg total) by mouth 2 (two) times daily as needed for cough.     Ear, Nose, and Throat:  Antitussives/Expectorants Failed - 10/03/2020 11:13 AM      Failed - Valid encounter within last 12 months    Recent Outpatient Visits           3 weeks ago Stress fracture of pelvis, initial encounter   [facility] Montel Culver, MD   6 months ago Encounter for annual physical exam   [facility] [place], Dionne Bucy, MD   1 year ago Prediabetes   [facility] [place], Dionne Bucy, MD   1 year ago Encounter for annual physical exam   [facility] [place], Dionne Bucy, MD   1 year ago Need for Tdap vaccination   [facility], Dionne Bucy, MD

## 2020-10-04 ENCOUNTER — Other Ambulatory Visit: Payer: Self-pay

## 2020-10-04 ENCOUNTER — Encounter: Payer: Self-pay | Admitting: Family Medicine

## 2020-10-04 MED ORDER — BENZONATATE 100 MG PO CAPS
100.0000 mg | ORAL_CAPSULE | Freq: Two times a day (BID) | ORAL | 0 refills | Status: DC | PRN
  Filled 2020-10-04: qty 20, 10d supply, fill #0

## 2020-10-07 ENCOUNTER — Other Ambulatory Visit: Payer: Self-pay

## 2020-10-07 MED ORDER — BENZONATATE 100 MG PO CAPS
100.0000 mg | ORAL_CAPSULE | Freq: Two times a day (BID) | ORAL | 1 refills | Status: DC | PRN
  Filled 2020-10-07: qty 90, 45d supply, fill #0

## 2020-10-08 ENCOUNTER — Other Ambulatory Visit: Payer: Self-pay

## 2020-10-11 NOTE — Telephone Encounter (Signed)
Patient decided on Pills. Mirena not received.

## 2020-10-22 ENCOUNTER — Encounter: Payer: Self-pay | Admitting: Family Medicine

## 2020-10-25 ENCOUNTER — Other Ambulatory Visit: Payer: Self-pay

## 2020-10-25 DIAGNOSIS — M869 Osteomyelitis, unspecified: Secondary | ICD-10-CM | POA: Insufficient documentation

## 2020-11-25 ENCOUNTER — Other Ambulatory Visit: Payer: Self-pay

## 2020-12-17 ENCOUNTER — Other Ambulatory Visit: Payer: Self-pay

## 2020-12-19 ENCOUNTER — Other Ambulatory Visit: Payer: Self-pay

## 2020-12-20 ENCOUNTER — Other Ambulatory Visit: Payer: Self-pay

## 2021-01-21 ENCOUNTER — Other Ambulatory Visit: Payer: Self-pay | Admitting: Family Medicine

## 2021-01-21 ENCOUNTER — Telehealth: Payer: Self-pay

## 2021-01-21 DIAGNOSIS — Z1231 Encounter for screening mammogram for malignant neoplasm of breast: Secondary | ICD-10-CM

## 2021-01-21 DIAGNOSIS — E892 Postprocedural hypoparathyroidism: Secondary | ICD-10-CM

## 2021-01-21 DIAGNOSIS — E139 Other specified diabetes mellitus without complications: Secondary | ICD-10-CM

## 2021-01-21 NOTE — Telephone Encounter (Signed)
Patient wants to have labs done before her appt with Dr. Raechel Chute the end of March and would like to have labs done before that visit.  Also wants mammogram, bone density scheduled.    Also needs a physical appt with Dr. Jacinto Reap around March 24th.   Please let patient know when she can schedule all this and appt for cpe.

## 2021-01-23 NOTE — Telephone Encounter (Signed)
Pt states she has set her annual up at [facility].She does not need CPE but does want to come in for f/u

## 2021-01-23 NOTE — Telephone Encounter (Signed)
Ok to order lipid panel, glutamic acid, insulin level, a1c, c peptide, cmp, cbc, tsh and associate with her diabetes diagnosis and h/o hyperparathyroidism. Ok to order DEXA (h/o parathyroidism), and screening mammo ([place]). Please find any 20 min appt for her CPE in March. Thanks!

## 2021-01-23 NOTE — Telephone Encounter (Signed)
Labs and bone density ordered.

## 2021-01-23 NOTE — Addendum Note (Signed)
Addended by: Shawna Orleans on: 01/23/2021 09:12 AM   Modules accepted: Orders

## 2021-01-24 NOTE — Telephone Encounter (Signed)
Sent message through [place] advising of next available appt with Dr. Jacinto Reap.

## 2021-01-31 ENCOUNTER — Other Ambulatory Visit: Payer: Self-pay

## 2021-02-03 ENCOUNTER — Ambulatory Visit: Payer: No Typology Code available for payment source | Admitting: Family Medicine

## 2021-02-03 ENCOUNTER — Other Ambulatory Visit: Payer: Self-pay

## 2021-02-14 ENCOUNTER — Other Ambulatory Visit: Payer: Self-pay

## 2021-02-14 MED ORDER — DIAZEPAM 5 MG PO TABS
ORAL_TABLET | ORAL | 0 refills | Status: DC
  Filled 2021-02-17: qty 4, 2d supply, fill #0
  Filled 2021-03-10: qty 4, 4d supply, fill #0

## 2021-02-17 ENCOUNTER — Other Ambulatory Visit: Payer: Self-pay

## 2021-02-20 ENCOUNTER — Ambulatory Visit (INDEPENDENT_AMBULATORY_CARE_PROVIDER_SITE_OTHER): Payer: No Typology Code available for payment source | Admitting: Obstetrics and Gynecology

## 2021-02-20 ENCOUNTER — Encounter: Payer: Self-pay | Admitting: Obstetrics and Gynecology

## 2021-02-20 ENCOUNTER — Other Ambulatory Visit: Payer: Self-pay

## 2021-02-20 ENCOUNTER — Other Ambulatory Visit (HOSPITAL_COMMUNITY)
Admission: RE | Admit: 2021-02-20 | Discharge: 2021-02-20 | Disposition: A | Discharge status: Home / Self Care | Payer: No Typology Code available for payment source | Source: Ambulatory Visit | Attending: Obstetrics and Gynecology | Admitting: Obstetrics and Gynecology

## 2021-02-20 VITALS — BP 100/60 | Ht 64.0 in | Wt 125.0 lb

## 2021-02-20 DIAGNOSIS — Z124 Encounter for screening for malignant neoplasm of cervix: Secondary | ICD-10-CM

## 2021-02-20 DIAGNOSIS — N921 Excessive and frequent menstruation with irregular cycle: Secondary | ICD-10-CM

## 2021-02-20 DIAGNOSIS — Z1151 Encounter for screening for human papillomavirus (HPV): Secondary | ICD-10-CM | POA: Insufficient documentation

## 2021-02-20 DIAGNOSIS — Z01419 Encounter for gynecological examination (general) (routine) without abnormal findings: Secondary | ICD-10-CM

## 2021-02-20 DIAGNOSIS — Z1231 Encounter for screening mammogram for malignant neoplasm of breast: Secondary | ICD-10-CM

## 2021-02-20 LAB — COMPREHENSIVE METABOLIC PANEL
ALT: 27 IU/L (ref 0–32)
AST: 31 IU/L (ref 0–40)
Albumin/Globulin Ratio: 2 (ref 1.2–2.2)
Albumin: 4.7 g/dL (ref 3.8–4.8)
Alkaline Phosphatase: 46 IU/L (ref 44–121)
BUN/Creatinine Ratio: 19 (ref 9–23)
BUN: 15 mg/dL (ref 6–24)
Bilirubin Total: 0.6 mg/dL (ref 0.0–1.2)
CO2: 24 mmol/L (ref 20–29)
Calcium: 9.5 mg/dL (ref 8.7–10.2)
Chloride: 101 mmol/L (ref 96–106)
Creatinine, Ser: 0.77 mg/dL (ref 0.57–1.00)
Globulin, Total: 2.4 g/dL (ref 1.5–4.5)
Glucose: 112 mg/dL — ABNORMAL HIGH (ref 70–99)
Potassium: 3.8 mmol/L (ref 3.5–5.2)
Sodium: 140 mmol/L (ref 134–144)
Total Protein: 7.1 g/dL (ref 6.0–8.5)
eGFR: 95 mL/min/{1.73_m2} (ref >59)

## 2021-02-20 LAB — CBC WITH DIFFERENTIAL/PLATELET
Basophils Absolute: 0 10*3/uL (ref 0.0–0.2)
Basos: 0 %
EOS (ABSOLUTE): 0 10*3/uL (ref 0.0–0.4)
Eos: 1 %
Hematocrit: 38.9 % (ref 34.0–46.6)
Hemoglobin: 13.1 g/dL (ref 11.1–15.9)
Immature Grans (Abs): 0 10*3/uL (ref 0.0–0.1)
Immature Granulocytes: 0 %
Lymphocytes Absolute: 1.8 10*3/uL (ref 0.7–3.1)
Lymphs: 36 %
MCH: 29.7 pg (ref 26.6–33.0)
MCHC: 33.7 g/dL (ref 31.5–35.7)
MCV: 88 fL (ref 79–97)
Monocytes Absolute: 0.6 10*3/uL (ref 0.1–0.9)
Monocytes: 12 %
Neutrophils Absolute: 2.6 10*3/uL (ref 1.4–7.0)
Neutrophils: 51 %
Platelets: 158 10*3/uL (ref 150–450)
RBC: 4.41 x10E6/uL (ref 3.77–5.28)
RDW: 11.9 % (ref 11.7–15.4)
WBC: 5 10*3/uL (ref 3.4–10.8)

## 2021-02-20 LAB — HEMOGLOBIN A1C
Est. average glucose Bld gHb Est-mCnc: 143 mg/dL
Hgb A1c MFr Bld: 6.6 % — ABNORMAL HIGH (ref 4.8–5.6)

## 2021-02-20 LAB — TSH: TSH: 1.48 u[IU]/mL (ref 0.450–4.500)

## 2021-02-20 LAB — LIPID PANEL WITH LDL/HDL RATIO
Cholesterol, Total: 201 mg/dL — ABNORMAL HIGH (ref 100–199)
HDL: 79 mg/dL (ref >39)
LDL Chol Calc (NIH): 116 mg/dL — ABNORMAL HIGH (ref 0–99)
LDL/HDL Ratio: 1.5 ratio (ref 0.0–3.2)
Triglycerides: 33 mg/dL (ref 0–149)
VLDL Cholesterol Cal: 6 mg/dL (ref 5–40)

## 2021-02-20 LAB — INSULIN AND C-PEPTIDE, SERUM
C-Peptide: 0.9 ng/mL — ABNORMAL LOW (ref 1.1–4.4)
INSULIN: 3.5 u[IU]/mL (ref 2.6–24.9)

## 2021-02-20 LAB — GLUTAMIC ACID DECARBOXYLASE AUTO ABS: Glutamic Acid Decarb Ab: 12.2 U/mL — ABNORMAL HIGH (ref 0.0–5.0)

## 2021-02-20 NOTE — Patient Instructions (Signed)
I value your feedback and you entrusting us with your care. If you get a [facility] patient survey, I would appreciate you taking the time to let us know about your experience today. Thank you! ? ? ?

## 2021-02-20 NOTE — Progress Notes (Signed)
PCP: Virginia Crews, MD   Chief Complaint  Patient presents with   Gynecologic Exam    Heavy cycles after IUD removal    HPI:      [name] is a 51 y.o. [phone] whose LMP was No LMP recorded. Patient is perimenopausal., presents today for her annual examination.  Her menses are every 34-40 days, or skips months, lasting 10 days, 4 heavy days, changing pads/tampons every 1-2 hrs; rest of days light or spotting.  Had Mirena in past for menorrhagia, removed 05/2020 due to pain from [facility] (has 3 small leio per 9/22 CT pelvis). Dr. Georgianne Fick unable to place new IUD with GYN u/s. Tried prog only options but affected glucose control. Just tolerating for now. Normal CBC 2/23.   Sex activity: single partner, contraception - tubal ligation. She does not have vaginal dryness.  Last Pap: 02/24/18 Results were: no abnormalities /neg HPV DNA.   Last mammogram: 04/29/20 Results were: normal--routine follow-up in 12 months; has mammo 4/23 There is no FH of breast cancer. There is no FH of ovarian cancer. The patient does not do self-breast exams. Has LT breast nodule, eval by Dr. Bary Castilla in past as lymph node. No change per pt.  Colonoscopy: 2016 Repeat due after 10 years.   Tobacco use: The patient denies current or previous tobacco use. Alcohol use: social drinker Exercise: very active  She does get adequate calcium and Vitamin D in her diet.  Labs with PCP.   Patient Active Problem List   Diagnosis Date Noted   Osteitis pubis ([place]) 10/25/2020   Stress fx pelvis 09/12/2020   Left groin pain 09/12/2020   Latent autoimmune diabetes in adults (LADA), managed as type 1 ([place]) 07/11/2020   Mass of upper outer quadrant of left breast 07/08/2018   Autoantibody titer elevated 03/10/2018   Asthma exacerbation 04/03/2015   Asthma with allergic rhinitis 02/15/2015   Esophagitis, reflux 02/15/2015   H/O parathyroidectomy 02/15/2015   H/O tubal ligation 02/15/2015   Avitaminosis D  02/15/2015   Allergic rhinitis 07/31/2014   History of anemia 02/22/2014    Past Surgical History:  Procedure Laterality Date   BREAST CYST ASPIRATION Right    CESAREAN SECTION     KNEE ARTHROSCOPY     NECK EXPLORATION     PARATHYROIDECTOMY     TUBAL LIGATION      Family History  Problem Relation Age of Onset   Hypertension Mother    Diabetes Mother    Depression Mother    Diabetes Father    Benign prostatic hyperplasia Father    Heart disease Father    Healthy Brother    Breast cancer Neg Hx    Colon cancer Neg Hx    Ovarian cancer Neg Hx     Social History   Socioeconomic History   Marital status: Married    Spouse name: Dealer   Number of children: 2   Years of education: 20   Highest education level: Occupational hygienist History   Occupation: Oceanographer: [employer]  Tobacco Use   Smoking status: Never   Smokeless tobacco: Never  Vaping Use   Vaping Use: Never used  Substance and Sexual Activity   Alcohol use: Not Currently   Drug use: Never   Sexual activity: Yes    Partners: Male    Birth control/protection: Surgical, None    Comment: Tubal Ligation  Other Topics Concern   Not on file  Social History Narrative   Not on file   Social Determinants of Health   Financial Resource Strain: Not on file  Food Insecurity: Not on file  Transportation Needs: Not on file  Physical Activity: Not on file  Stress: Not on file  Social Connections: Not on file  Intimate Partner Violence: Not on file     Current Outpatient Medications:    albuterol (VENTOLIN HFA) 108 (90 Base) MCG/ACT inhaler, Inhale 2 puffs into the lungs every 6 (six) hours as needed for wheezing or shortness of breath., Disp: , Rfl:    Cholecalciferol (VITAMIN D) 2000 units CAPS, Take by mouth., Disp: , Rfl:    Continuous Blood Gluc Sensor (DEXCOM G6 SENSOR) MISC, Use to monitor blood sugar.  Replace every 10 days, Disp: 3 each, Rfl: 12   Continuous  Blood Gluc Transmit (DEXCOM G6 TRANSMITTER) MISC, Use to monitor blood sugar.  Replace every 3 months, Disp: 1 each, Rfl: 3   Continuous Blood Gluc Transmit (DEXCOM G6 TRANSMITTER) MISC, Use to monitor blood sugar.  Replace every 3 months, Disp: 1 each, Rfl: 3   fluticasone (FLONASE) 50 MCG/ACT nasal spray, Place 1 spray into both nostrils daily., Disp: , Rfl:    glucose blood (FREESTYLE LITE) test strip, Use to test blood sugar 1 to 3 times daily, Disp: 100 each, Rfl: 11   insulin degludec (TRESIBA FLEXTOUCH) 100 UNIT/ML FlexTouch Pen, Inject 5 Units subcutaneously once daily .  Titrate based on your sugar to a max daily dose of 50 units., Disp: 15 mL, Rfl: 6   Insulin Pen Needle (UNIFINE PENTIPS) 32G X 4 MM MISC, use daily as directed, Disp: 100 each, Rfl: 4   loratadine (CLARITIN) 10 MG tablet, Take 10 mg by mouth daily., Disp: , Rfl:    meloxicam (MOBIC) 15 MG tablet, Take 1 tablet (15 mg total) by mouth daily., Disp: 30 tablet, Rfl: 3   montelukast (SINGULAIR) 10 MG tablet, Take 1 tablet (10 mg total) by mouth daily., Disp: 90 tablet, Rfl: 3   Peak Flow Meter DEVI, Use to monitor peak flow daily, Disp: 1 each, Rfl: 0   tretinoin (RETIN-A) 0.05 % cream, Apply topically at bedtime., Disp: 45 g, Rfl: 11   benzonatate (TESSALON) 100 MG capsule, Take 1 capsule (100 mg total) by mouth 2 (two) times daily as needed for cough. (Patient not taking: Reported on 02/20/2021), Disp: 90 capsule, Rfl: 1   diazepam (VALIUM) 5 MG tablet, Take 1 tablet by mouth the night prior to dental appt, then take 1 tablet 45 mins prior to dental appt (Patient not taking: Reported on 02/20/2021), Disp: 4 tablet, Rfl: 0     ROS:  Review of Systems  Constitutional:  Negative for fatigue, fever and unexpected weight change.  Respiratory:  Negative for cough, shortness of breath and wheezing.   Cardiovascular:  Negative for chest pain, palpitations and leg swelling.  Gastrointestinal:  Negative for blood in stool,  constipation, diarrhea, nausea and vomiting.  Endocrine: Negative for cold intolerance, heat intolerance and polyuria.  Genitourinary:  Negative for dyspareunia, dysuria, flank pain, frequency, genital sores, hematuria, menstrual problem, pelvic pain, urgency, vaginal bleeding, vaginal discharge and vaginal pain.  Musculoskeletal:  Negative for back pain, joint swelling and myalgias.  Skin:  Negative for rash.  Neurological:  Negative for dizziness, syncope, light-headedness, numbness and headaches.  Hematological:  Negative for adenopathy.  Psychiatric/Behavioral:  Negative for agitation, confusion, sleep disturbance and suicidal ideas. The patient is not nervous/anxious.   BREAST: No  symptoms    Objective: BP 100/60    Ht 5\' 4"  (1.626 m)    Wt 125 lb (56.7 kg)    BMI 21.46 kg/m    Physical Exam Constitutional:      Appearance: She is well-developed.  Genitourinary:     Vulva normal.     Right Labia: No rash, tenderness or lesions.    Left Labia: No tenderness, lesions or rash.    No vaginal discharge, erythema or tenderness.      Right Adnexa: not tender and no mass present.    Left Adnexa: not tender and no mass present.    No cervical friability or polyp.     Uterus is not enlarged or tender.  Breasts:    Right: No mass, nipple discharge, skin change or tenderness.     Left: No mass, nipple discharge, skin change or tenderness.  Neck:     Thyroid: No thyromegaly.  Cardiovascular:     Rate and Rhythm: Normal rate and regular rhythm.     Heart sounds: Normal heart sounds. No murmur heard. Pulmonary:     Effort: Pulmonary effort is normal.     Breath sounds: Normal breath sounds.  Abdominal:     Palpations: Abdomen is soft.     Tenderness: There is no abdominal tenderness. There is no guarding or rebound.  Musculoskeletal:        General: Normal range of motion.     Cervical back: Normal range of motion.  Lymphadenopathy:     Cervical: No cervical adenopathy.   Neurological:     General: No focal deficit present.     Mental Status: She is alert and oriented to person, place, and time.     Cranial Nerves: No cranial nerve deficit.  Skin:    General: Skin is warm and dry.  Psychiatric:        Mood and Affect: Mood normal.        Behavior: Behavior normal.        Thought Content: Thought content normal.        Judgment: Judgment normal.  Vitals reviewed.    Assessment/Plan:  Encounter for annual routine gynecological examination  Cervical cancer screening - Plan: Cytology - PAP  Screening for HPV (human papillomavirus) - Plan: Cytology - PAP  Encounter for screening mammogram for malignant neoplasm of breast; pt has mammo appt.   Menorrhagia--tolerating for now. Hx of leio/perimenopause. F/u prn.          GYN counsel breast self exam, mammography screening, menopause, adequate intake of calcium and vitamin D, diet and exercise    F/U  Return in about 1 year (around 02/20/2022).  Tareka Jhaveri B. Tylie Golonka, PA-C 02/20/2021 2:04 PM

## 2021-02-24 LAB — CYTOLOGY - PAP
Comment: NEGATIVE
Diagnosis: NEGATIVE
High risk HPV: NEGATIVE

## 2021-03-03 ENCOUNTER — Other Ambulatory Visit: Payer: Self-pay

## 2021-03-10 ENCOUNTER — Other Ambulatory Visit: Payer: Self-pay

## 2021-03-12 NOTE — Progress Notes (Signed)
I,Sulibeya S Dimas,acting as a Neurosurgeon for Shirlee Latch, MD.,have documented all relevant documentation on the behalf of Shirlee Latch, MD,as directed by  Shirlee Latch, MD while in the presence of Shirlee Latch, MD.  Established patient visit   Patient: Tanya Adams   DOB: 1970/07/31   51 y.o. Female  MRN: [id number] Visit Date: 03/13/2021  Today's healthcare provider: Shirlee Latch, MD   Chief Complaint  Patient presents with   Follow-up   Subjective    HPI  Diabetes Mellitus Type II, Follow-up  Lab Results  Component Value Date   HGBA1C 6.6 (H) 02/17/2021   HGBA1C 6.4 (A) 06/26/2020   HGBA1C 6.4 (H) 04/08/2020   Wt Readings from Last 3 Encounters:  03/13/21 124 lb 8 oz (56.5 kg)  02/20/21 125 lb (56.7 kg)  09/12/20 121 lb (54.9 kg)   Last seen for diabetes 11 months ago.  Management since then includes patient is F/B endocrinology. She reports excellent compliance with treatment. She is not having side effects.  Symptoms: No fatigue No foot ulcerations  No appetite changes No nausea  No paresthesia of the feet  No polydipsia  No polyuria No visual disturbances   No vomiting     Home blood sugar records:  110s    Pertinent Labs: Lab Results  Component Value Date   CHOL 201 (H) 02/17/2021   HDL 79 02/17/2021   LDLCALC 116 (H) 02/17/2021   TRIG 33 02/17/2021   CHOLHDL 2.8 04/08/2020   Lab Results  Component Value Date   NA 140 02/17/2021   K 3.8 02/17/2021   CREATININE 0.77 02/17/2021   EGFR 95 02/17/2021   MICROALBUR 20 06/26/2020     ---------------------------------------------------------------------------------------------------   Medications: Outpatient Medications Prior to Visit  Medication Sig   albuterol (VENTOLIN HFA) 108 (90 Base) MCG/ACT inhaler Inhale 2 puffs into the lungs every 6 (six) hours as needed for wheezing or shortness of breath.   benzonatate (TESSALON) 100 MG capsule Take 1 capsule (100 mg  total) by mouth 2 (two) times daily as needed for cough.   Cholecalciferol (VITAMIN D) 2000 units CAPS Take by mouth.   Continuous Blood Gluc Sensor (DEXCOM G6 SENSOR) MISC Use to monitor blood sugar.  Replace every 10 days   Continuous Blood Gluc Transmit (DEXCOM G6 TRANSMITTER) MISC Use to monitor blood sugar.  Replace every 3 months   Continuous Blood Gluc Transmit (DEXCOM G6 TRANSMITTER) MISC Use to monitor blood sugar.  Replace every 3 months   fluticasone (FLONASE) 50 MCG/ACT nasal spray Place 1 spray into both nostrils daily.   glucose blood (FREESTYLE LITE) test strip Use to test blood sugar 1 to 3 times daily   insulin degludec (TRESIBA FLEXTOUCH) 100 UNIT/ML FlexTouch Pen Inject 5 Units subcutaneously once daily .  Titrate based on your sugar to a max daily dose of 50 units.   Insulin Pen Needle (UNIFINE PENTIPS) 32G X 4 MM MISC use daily as directed   loratadine (CLARITIN) 10 MG tablet Take 10 mg by mouth daily.   meloxicam (MOBIC) 15 MG tablet Take 1 tablet (15 mg total) by mouth daily.   montelukast (SINGULAIR) 10 MG tablet Take 1 tablet (10 mg total) by mouth daily.   Peak Flow Meter DEVI Use to monitor peak flow daily   tretinoin (RETIN-A) 0.05 % cream Apply topically at bedtime.   [DISCONTINUED] diazepam (VALIUM) 5 MG tablet Take 1 tablet by mouth the night prior to dental appt, then take 1  tablet 45 mins prior to dental appt (Patient not taking: Reported on 02/20/2021)   No facility-administered medications prior to visit.    Review of Systems per HPI  Last CBC Lab Results  Component Value Date   WBC 5.0 02/17/2021   HGB 13.1 02/17/2021   HCT 38.9 02/17/2021   MCV 88 02/17/2021   MCH 29.7 02/17/2021   RDW 11.9 02/17/2021   PLT 158 02/17/2021   Last metabolic panel Lab Results  Component Value Date   GLUCOSE 112 (H) 02/17/2021   NA 140 02/17/2021   K 3.8 02/17/2021   CL 101 02/17/2021   CO2 24 02/17/2021   BUN 15 02/17/2021   CREATININE 0.77 02/17/2021   EGFR  95 02/17/2021   CALCIUM 9.5 02/17/2021   PROT 7.1 02/17/2021   ALBUMIN 4.7 02/17/2021   LABGLOB 2.4 02/17/2021   AGRATIO 2.0 02/17/2021   BILITOT 0.6 02/17/2021   ALKPHOS 46 02/17/2021   AST 31 02/17/2021   ALT 27 02/17/2021   Last lipids Lab Results  Component Value Date   CHOL 201 (H) 02/17/2021   HDL 79 02/17/2021   LDLCALC 116 (H) 02/17/2021   TRIG 33 02/17/2021   CHOLHDL 2.8 04/08/2020   Last hemoglobin A1c Lab Results  Component Value Date   HGBA1C 6.6 (H) 02/17/2021   Last thyroid functions Lab Results  Component Value Date   TSH 1.480 02/17/2021   Last vitamin D Lab Results  Component Value Date   VD25OH 47.9 04/08/2020   Last vitamin B12 and Folate Lab Results  Component Value Date   VITAMINB12 588 05/25/2018       Objective    BP 104/68 (BP Location: Left Arm, Patient Position: Sitting, Cuff Size: Normal)   Pulse (!) 56   Temp 97.8 F (36.6 C) (Temporal)   Resp 16   Wt 124 lb 8 oz (56.5 kg)   BMI 21.37 kg/m  BP Readings from Last 3 Encounters:  03/13/21 104/68  02/20/21 100/60  09/12/20 102/68   Wt Readings from Last 3 Encounters:  03/13/21 124 lb 8 oz (56.5 kg)  02/20/21 125 lb (56.7 kg)  09/12/20 121 lb (54.9 kg)      Physical Exam Vitals reviewed.  Constitutional:      General: She is not in acute distress.    Appearance: Normal appearance. She is well-developed. She is not diaphoretic.  HENT:     Head: Normocephalic and atraumatic.  Eyes:     General: No scleral icterus.    Conjunctiva/sclera: Conjunctivae normal.  Neck:     Thyroid: No thyromegaly.  Cardiovascular:     Rate and Rhythm: Normal rate and regular rhythm.     Pulses: Normal pulses.     Heart sounds: Normal heart sounds. No murmur heard. Pulmonary:     Effort: Pulmonary effort is normal. No respiratory distress.     Breath sounds: Normal breath sounds. No wheezing, rhonchi or rales.  Musculoskeletal:     Cervical back: Neck supple.     Right lower leg: No  edema.     Left lower leg: No edema.  Lymphadenopathy:     Cervical: No cervical adenopathy.  Skin:    General: Skin is warm and dry.     Findings: No rash.  Neurological:     Mental Status: She is alert and oriented to person, place, and time. Mental status is at baseline.  Psychiatric:        Mood and Affect: Mood normal.  Behavior: Behavior normal.    Diabetic Foot Exam - Simple   Simple Foot Form Diabetic Foot exam was performed with the following findings: Yes 03/13/2021  2:46 PM  Visual Inspection No deformities, no ulcerations, no other skin breakdown bilaterally: Yes Sensation Testing Intact to touch and monofilament testing bilaterally: Yes Pulse Check Posterior Tibialis and Dorsalis pulse intact bilaterally: Yes Comments       No results found for any visits on 03/13/21.  Assessment & Plan     Problem List Items Addressed This Visit       Endocrine   Latent autoimmune diabetes in adults (LADA), managed as type 1 (HCC)    Managed by endocrinology She is having hypoglycemia whenever she uses short acting insulin as instructed She is being careful to eat regularly when running long distances and to monitor for hypoglycemia carefully No changes to her medications today Upcoming eye exam Foot exam completed today U ACR today Repeat A1c in May      Relevant Orders   Urine Microalbumin w/creat. ratio   Hemoglobin A1c     Other   H/O parathyroidectomy    Recent labs normal Continue to monitor      Palpitations - Primary    New problem over the past few months Intermittent in nature with a feeling of dizziness and a hitch in her breath She is not having any symptoms while running, which is reassuring We do need to rule out underlying arrhythmia, such as A-fib She has had a normal CMP, CBC, TSH recently Zio patch x14 days Further evaluation and management pending results      Relevant Orders   LONG TERM MONITOR (3-14 DAYS)     Return in about  6 months (around 09/13/2021) for CPE.      I, Shirlee Latch, MD, have reviewed all documentation for this visit. The documentation on 03/13/21 for the exam, diagnosis, procedures, and orders are all accurate and complete.   Kaybree Williams, Marzella Schlein, MD, MPH [facility]

## 2021-03-13 ENCOUNTER — Other Ambulatory Visit: Payer: Self-pay

## 2021-03-13 ENCOUNTER — Encounter: Payer: Self-pay | Admitting: Family Medicine

## 2021-03-13 ENCOUNTER — Ambulatory Visit (INDEPENDENT_AMBULATORY_CARE_PROVIDER_SITE_OTHER): Payer: No Typology Code available for payment source | Admitting: Family Medicine

## 2021-03-13 ENCOUNTER — Inpatient Hospital Stay (INDEPENDENT_AMBULATORY_CARE_PROVIDER_SITE_OTHER): Payer: No Typology Code available for payment source

## 2021-03-13 VITALS — BP 104/68 | HR 56 | Temp 97.8°F | Resp 16 | Wt 124.5 lb

## 2021-03-13 DIAGNOSIS — E892 Postprocedural hypoparathyroidism: Secondary | ICD-10-CM

## 2021-03-13 DIAGNOSIS — R002 Palpitations: Secondary | ICD-10-CM

## 2021-03-13 DIAGNOSIS — E139 Other specified diabetes mellitus without complications: Secondary | ICD-10-CM

## 2021-03-13 NOTE — Assessment & Plan Note (Signed)
Recent labs normal ?Continue to monitor ?

## 2021-03-13 NOTE — Assessment & Plan Note (Signed)
New problem over the past few months ?Intermittent in nature with a feeling of dizziness and a hitch in her breath ?She is not having any symptoms while running, which is reassuring ?We do need to rule out underlying arrhythmia, such as A-fib ?She has had a normal CMP, CBC, TSH recently ?Zio patch x14 days ?Further evaluation and management pending results ?

## 2021-03-13 NOTE — Assessment & Plan Note (Signed)
Managed by endocrinology ?She is having hypoglycemia whenever she uses short acting insulin as instructed ?She is being careful to eat regularly when running long distances and to monitor for hypoglycemia carefully ?No changes to her medications today ?Upcoming eye exam ?Foot exam completed today ?U ACR today ?Repeat A1c in May ?

## 2021-03-21 ENCOUNTER — Ambulatory Visit (INDEPENDENT_AMBULATORY_CARE_PROVIDER_SITE_OTHER): Payer: No Typology Code available for payment source | Admitting: Physician Assistant

## 2021-03-21 ENCOUNTER — Other Ambulatory Visit: Payer: Self-pay

## 2021-03-21 DIAGNOSIS — Z23 Encounter for immunization: Secondary | ICD-10-CM

## 2021-03-24 NOTE — Progress Notes (Signed)
Patient presented for nurse visit to obtain Shingles vaccine.  ?

## 2021-03-25 ENCOUNTER — Other Ambulatory Visit: Payer: Self-pay

## 2021-03-26 ENCOUNTER — Other Ambulatory Visit: Payer: Self-pay

## 2021-03-27 ENCOUNTER — Other Ambulatory Visit: Payer: Self-pay

## 2021-03-27 MED ORDER — DEXCOM G7 SENSOR MISC
4 refills | Status: DC
  Filled 2021-03-27 – 2021-05-15 (×3): qty 9, 90d supply, fill #0
  Filled 2021-08-26: qty 9, 90d supply, fill #1
  Filled 2021-12-09: qty 3, 30d supply, fill #2
  Filled 2021-12-17: qty 9, 90d supply, fill #2

## 2021-03-27 MED ORDER — INSULIN ASPART FLEXPEN 100 UNIT/ML ~~LOC~~ SOPN
PEN_INJECTOR | SUBCUTANEOUS | 6 refills | Status: DC
Start: 2021-03-27 — End: 2021-05-15
  Filled 2021-03-27: qty 15, 30d supply, fill #0

## 2021-04-01 ENCOUNTER — Other Ambulatory Visit: Payer: Self-pay

## 2021-04-01 MED ORDER — INSULIN LISPRO (1 UNIT DIAL) 100 UNIT/ML (KWIKPEN)
PEN_INJECTOR | SUBCUTANEOUS | 6 refills | Status: DC
  Filled 2021-04-01: qty 15, 30d supply, fill #0
  Filled 2021-07-01: qty 15, 30d supply, fill #1
  Filled 2022-02-02: qty 15, 30d supply, fill #2

## 2021-04-02 ENCOUNTER — Other Ambulatory Visit: Payer: Self-pay

## 2021-04-03 ENCOUNTER — Other Ambulatory Visit: Payer: Self-pay

## 2021-04-30 ENCOUNTER — Other Ambulatory Visit: Payer: Self-pay | Admitting: Family Medicine

## 2021-04-30 ENCOUNTER — Telehealth: Payer: No Typology Code available for payment source | Admitting: Physician Assistant

## 2021-04-30 ENCOUNTER — Other Ambulatory Visit: Payer: Self-pay

## 2021-04-30 ENCOUNTER — Encounter: Payer: Self-pay | Admitting: Family Medicine

## 2021-04-30 DIAGNOSIS — J4540 Moderate persistent asthma, uncomplicated: Secondary | ICD-10-CM

## 2021-04-30 DIAGNOSIS — J208 Acute bronchitis due to other specified organisms: Secondary | ICD-10-CM | POA: Diagnosis not present

## 2021-04-30 DIAGNOSIS — B9689 Other specified bacterial agents as the cause of diseases classified elsewhere: Secondary | ICD-10-CM | POA: Diagnosis not present

## 2021-04-30 MED ORDER — BENZONATATE 100 MG PO CAPS
100.0000 mg | ORAL_CAPSULE | Freq: Two times a day (BID) | ORAL | 1 refills | Status: DC | PRN
  Filled 2021-04-30: qty 90, 45d supply, fill #0

## 2021-04-30 MED ORDER — ALBUTEROL SULFATE HFA 108 (90 BASE) MCG/ACT IN AERS
2.0000 | INHALATION_SPRAY | Freq: Four times a day (QID) | RESPIRATORY_TRACT | 0 refills | Status: AC | PRN
  Filled 2021-04-30: qty 18, 25d supply, fill #0

## 2021-04-30 MED ORDER — TRELEGY ELLIPTA 100-62.5-25 MCG/ACT IN AEPB
1.0000 | INHALATION_SPRAY | Freq: Every day | RESPIRATORY_TRACT | 11 refills | Status: DC
  Filled 2021-04-30: qty 60, 30d supply, fill #0

## 2021-04-30 MED ORDER — AZITHROMYCIN 250 MG PO TABS
ORAL_TABLET | ORAL | 0 refills | Status: AC
  Filled 2021-04-30: qty 6, 5d supply, fill #0

## 2021-04-30 MED ORDER — MONTELUKAST SODIUM 10 MG PO TABS
10.0000 mg | ORAL_TABLET | Freq: Every day | ORAL | 3 refills | Status: DC
  Filled 2021-04-30: qty 90, 90d supply, fill #0

## 2021-04-30 NOTE — Patient Instructions (Signed)
?Loistine Chance, thank you for joining Mar Daring, PA-C for today's virtual visit.  While this provider is not your primary care provider (PCP), if your PCP is located in our provider database this encounter information will be shared with them immediately following your visit. ? ?Consent: ?(Patient) Tanya Adams provided verbal consent for this virtual visit at the beginning of the encounter. ? ?Current Medications: ? ?Current Outpatient Medications:  ?  albuterol (VENTOLIN HFA) 108 (90 Base) MCG/ACT inhaler, Inhale 2 puffs into the lungs every 6 (six) hours as needed for wheezing or shortness of breath., Disp: 18 g, Rfl: 0 ?  azithromycin (ZITHROMAX) 250 MG tablet, Take 2 tablets on day 1, then 1 tablet daily on days 2 through 5, Disp: 6 tablet, Rfl: 0 ?  benzonatate (TESSALON) 100 MG capsule, Take 1 capsule (100 mg total) by mouth 2 (two) times daily as needed for cough., Disp: 90 capsule, Rfl: 1 ?  Cholecalciferol (VITAMIN D) 2000 units CAPS, Take by mouth., Disp: , Rfl:  ?  Continuous Blood Gluc Sensor (DEXCOM G6 SENSOR) MISC, Use to monitor blood sugar.  Replace every 10 days, Disp: 3 each, Rfl: 12 ?  Continuous Blood Gluc Sensor (DEXCOM G7 SENSOR) MISC, Use 1 each every 10 (ten) days, Disp: 9 each, Rfl: 4 ?  Continuous Blood Gluc Transmit (DEXCOM G6 TRANSMITTER) MISC, Use to monitor blood sugar.  Replace every 3 months, Disp: 1 each, Rfl: 3 ?  Continuous Blood Gluc Transmit (DEXCOM G6 TRANSMITTER) MISC, Use to monitor blood sugar.  Replace every 3 months, Disp: 1 each, Rfl: 3 ?  fluticasone (FLONASE) 50 MCG/ACT nasal spray, Place 1 spray into both nostrils daily., Disp: , Rfl:  ?  Fluticasone-Umeclidin-Vilant (TRELEGY ELLIPTA) 100-62.5-25 MCG/ACT AEPB, Inhale 1 puff into the lungs daily., Disp: 60 each, Rfl: 11 ?  glucose blood (FREESTYLE LITE) test strip, Use to test blood sugar 1 to 3 times daily, Disp: 100 each, Rfl: 11 ?  Insulin Aspart FlexPen (NOVOLOG) 100 UNIT/ML, Up to 15 units plus  sliding scale with meals.  Max daily dose 50 units, Disp: 15 mL, Rfl: 6 ?  insulin degludec (TRESIBA FLEXTOUCH) 100 UNIT/ML FlexTouch Pen, Inject 5 Units subcutaneously once daily .  Titrate based on your sugar to a max daily dose of 50 units., Disp: 15 mL, Rfl: 6 ?  insulin lispro (HUMALOG) 100 UNIT/ML KwikPen, Inject up to 15 units plus sliding scale with meals.  Max daily dose 50 units, Disp: 15 mL, Rfl: 6 ?  Insulin Pen Needle (UNIFINE PENTIPS) 32G X 4 MM MISC, use daily as directed, Disp: 100 each, Rfl: 4 ?  loratadine (CLARITIN) 10 MG tablet, Take 10 mg by mouth daily., Disp: , Rfl:  ?  meloxicam (MOBIC) 15 MG tablet, Take 1 tablet (15 mg total) by mouth daily., Disp: 30 tablet, Rfl: 3 ?  montelukast (SINGULAIR) 10 MG tablet, Take 1 tablet (10 mg total) by mouth daily., Disp: 90 tablet, Rfl: 3 ?  Peak Flow Meter DEVI, Use to monitor peak flow daily, Disp: 1 each, Rfl: 0 ?  tretinoin (RETIN-A) 0.05 % cream, Apply topically at bedtime., Disp: 45 g, Rfl: 11  ? ?Medications ordered in this encounter:  ?Meds ordered this encounter  ?Medications  ? azithromycin (ZITHROMAX) 250 MG tablet  ?  Sig: Take 2 tablets on day 1, then 1 tablet daily on days 2 through 5  ?  Dispense:  6 tablet  ?  Refill:  0  ?  Order  Specific Question:   Supervising Provider  ?  Answer:   Noemi Chapel [[id number]]  ? Fluticasone-Umeclidin-Vilant (TRELEGY ELLIPTA) 100-62.5-25 MCG/ACT AEPB  ?  Sig: Inhale 1 puff into the lungs daily.  ?  Dispense:  60 each  ?  Refill:  11  ?  Order Specific Question:   Supervising Provider  ?  Answer:   Noemi Chapel [[id number]]  ? benzonatate (TESSALON) 100 MG capsule  ?  Sig: Take 1 capsule (100 mg total) by mouth 2 (two) times daily as needed for cough.  ?  Dispense:  90 capsule  ?  Refill:  1  ?  Order Specific Question:   Supervising Provider  ?  Answer:   Noemi Chapel [[id number]]  ? albuterol (VENTOLIN HFA) 108 (90 Base) MCG/ACT inhaler  ?  Sig: Inhale 2 puffs into the lungs every 6 (six) hours as needed for wheezing  or shortness of breath.  ?  Dispense:  18 g  ?  Refill:  0  ?  Order Specific Question:   Supervising Provider  ?  Answer:   Noemi Chapel [[id number]]  ?  ? ?*If you need refills on other medications prior to your next appointment, please contact your pharmacy* ? ?Follow-Up: ?Call back or seek an in-person evaluation if the symptoms worsen or if the condition fails to improve as anticipated. ? ?Other Instructions ?Acute Bronchitis, Adult ? ?Acute bronchitis is sudden inflammation of the main airways (bronchi) that come off the windpipe (trachea) in the lungs. The swelling causes the airways to get smaller and make more mucus than normal. This can make it hard to breathe and can cause coughing or noisy breathing (wheezing). ?Acute bronchitis may last several weeks. The cough may last longer. Allergies, asthma, and exposure to smoke may make the condition worse. ?What are the causes? ?This condition can be caused by germs and by substances that irritate the lungs, including: ?Cold and flu viruses. The most common cause of this condition is the virus that causes the common cold. ?Bacteria. This is less common. ?Breathing in substances that irritate the lungs, including: ?Smoke from cigarettes and other forms of tobacco. ?Dust and pollen. ?Fumes from household cleaning products, gases, or burned fuel. ?Indoor or outdoor air pollution. ?What increases the risk? ?The following factors may make you more likely to develop this condition: ?A weak body's defense system, also called the immune system. ?A condition that affects your lungs and breathing, such as asthma. ?What are the signs or symptoms? ?Common symptoms of this condition include: ?Coughing. This may bring up clear, yellow, or green mucus from your lungs (sputum). ?Wheezing. ?Runny or stuffy nose. ?Having too much mucus in your lungs (chest congestion). ?Shortness of breath. ?Aches and pains, including sore throat or chest. ?How is this diagnosed? ?This condition is  usually diagnosed based on: ?Your symptoms and medical history. ?A physical exam. ?You may also have other tests, including tests to rule out other conditions, such as pneumonia. These tests include: ?A test of lung function. ?Test of a mucus sample to look for the presence of bacteria. ?Tests to check the oxygen level in your blood. ?Blood tests. ?Chest X-ray. ?How is this treated? ?Most cases of acute bronchitis clear up over time without treatment. Your health care provider may recommend: ?Drinking more fluids to help thin your mucus so it is easier to cough up. ?Taking inhaled medicine (inhaler) to improve air flow in and out of your lungs. ?Using a vaporizer or a humidifier. These are  machines that add water to the air to help you breathe better. ?Taking a medicine that thins mucus and clears congestion (expectorant). ?Taking a medicine that prevents or stops coughing (cough suppressant). ?It is notcommon to take an antibiotic medicine for this condition. ?Follow these instructions at home: ? ?Take over-the-counter and prescription medicines only as told by your health care provider. ?Use an inhaler, vaporizer, or humidifier as told by your health care provider. ?Take two teaspoons (10 mL) of honey at bedtime to lessen coughing at night. ?Drink enough fluid to keep your urine pale yellow. ?Do not use any products that contain nicotine or tobacco. These products include cigarettes, chewing tobacco, and vaping devices, such as e-cigarettes. If you need help quitting, ask your health care provider. ?Get plenty of rest. ?Return to your normal activities as told by your health care provider. Ask your health care provider what activities are safe for you. ?Keep all follow-up visits. This is important. ?How is this prevented? ?To lower your risk of getting this condition again: ?Wash your hands often with soap and water for at least 20 seconds. If soap and water are not available, use hand sanitizer. ?Avoid contact  with people who have cold symptoms. ?Try not to touch your mouth, nose, or eyes with your hands. ?Avoid breathing in smoke or chemical fumes. Breathing smoke or chemical fumes will make your condition worse.

## 2021-04-30 NOTE — Progress Notes (Signed)
?Virtual Visit Consent  ? ?Tanya Adams, you are scheduled for a virtual visit with a [place] provider today.   ?  ?Just as with appointments in the office, your consent must be obtained to participate.  Your consent will be active for this visit and any virtual visit you may have with one of our providers in the next 365 days.   ?  ?If you have a MyChart account, a copy of this consent can be sent to you electronically.  All virtual visits are billed to your insurance company just like a traditional visit in the office.   ? ?As this is a virtual visit, video technology does not allow for your provider to perform a traditional examination.  This may limit your provider's ability to fully assess your condition.  If your provider identifies any concerns that need to be evaluated in person or the need to arrange testing (such as labs, EKG, etc.), we will make arrangements to do so.   ?  ?Although advances in technology are sophisticated, we cannot ensure that it will always work on either your end or our end.  If the connection with a video visit is poor, the visit may have to be switched to a telephone visit.  With either a video or telephone visit, we are not always able to ensure that we have a secure connection.    ? ?I need to obtain your verbal consent now.   Are you willing to proceed with your visit today?  ?  ?Tanya Adams has provided verbal consent on 04/30/2021 for a virtual visit (video or telephone). ?  ?Tanya Daring, PA-C  ? ?Date: 04/30/2021 12:32 PM ? ? ?Virtual Visit via Video Note  ? ?Tanya Adams, connected with  AFUA HOOTS  ([id number], 11/30/1970) on 04/30/21 at 12:30 PM EDT by a video-enabled telemedicine application and verified that I am speaking with the correct person using two identifiers. ? ?Location: ?Patient: Virtual Visit Location Patient: Mobile ?Provider: Virtual Visit Location Provider: Home Office ?  ?I discussed the limitations of evaluation and  management by telemedicine and the availability of in person appointments. The patient expressed understanding and agreed to proceed.   ? ?History of Present Illness: ?Tanya Adams is a 51 y.o. who identifies as a female who was assigned female at birth, and is being seen today for cough. ? ?HPI: Cough ?This is a new problem. The current episode started 1 to 4 weeks ago. The problem has been gradually worsening. The problem occurs every few minutes. The cough is Non-productive (over last 48 hours has worsened). Associated symptoms include chest pain (tightness), myalgias, a sore throat (from coughing) and wheezing. Pertinent negatives include no chills, ear congestion, ear pain, fever, headaches, nasal congestion, postnasal drip, rhinorrhea or shortness of breath. The symptoms are aggravated by lying down and pollens. She has tried a beta-agonist inhaler for the symptoms. The treatment provided no relief. Her past medical history is significant for asthma and bronchitis. There is no history of pneumonia.   ? ? ?Problems:  ?Patient Active Problem List  ? Diagnosis Date Noted  ? Palpitations 03/13/2021  ? Stress fx pelvis 09/12/2020  ? Left groin pain 09/12/2020  ? Latent autoimmune diabetes in adults (LADA), managed as type 1 ([place]) 07/11/2020  ? Mass of upper outer quadrant of left breast 07/08/2018  ? Autoantibody titer elevated 03/10/2018  ? Asthma exacerbation 04/03/2015  ? Asthma with allergic rhinitis 02/15/2015  ?  Esophagitis, reflux 02/15/2015  ? H/O parathyroidectomy 02/15/2015  ? H/O tubal ligation 02/15/2015  ? Avitaminosis D 02/15/2015  ? Allergic rhinitis 07/31/2014  ? History of anemia 02/22/2014  ?  ?Allergies:  ?Allergies  ?Allergen Reactions  ? Shellfish Allergy Anaphylaxis  ? Tape Rash  ?  Paper tape is OK  ? ?Medications:  ?Current Outpatient Medications:  ?  albuterol (VENTOLIN HFA) 108 (90 Base) MCG/ACT inhaler, Inhale 2 puffs into the lungs every 6 (six) hours as needed for wheezing or  shortness of breath., Disp: 18 g, Rfl: 0 ?  azithromycin (ZITHROMAX) 250 MG tablet, Take 2 tablets on day 1, then 1 tablet daily on days 2 through 5, Disp: 6 tablet, Rfl: 0 ?  benzonatate (TESSALON) 100 MG capsule, Take 1 capsule (100 mg total) by mouth 2 (two) times daily as needed for cough., Disp: 90 capsule, Rfl: 1 ?  Cholecalciferol (VITAMIN D) 2000 units CAPS, Take by mouth., Disp: , Rfl:  ?  Continuous Blood Gluc Sensor (DEXCOM G6 SENSOR) MISC, Use to monitor blood sugar.  Replace every 10 days, Disp: 3 each, Rfl: 12 ?  Continuous Blood Gluc Sensor (DEXCOM G7 SENSOR) MISC, Use 1 each every 10 (ten) days, Disp: 9 each, Rfl: 4 ?  Continuous Blood Gluc Transmit (DEXCOM G6 TRANSMITTER) MISC, Use to monitor blood sugar.  Replace every 3 months, Disp: 1 each, Rfl: 3 ?  Continuous Blood Gluc Transmit (DEXCOM G6 TRANSMITTER) MISC, Use to monitor blood sugar.  Replace every 3 months, Disp: 1 each, Rfl: 3 ?  fluticasone (FLONASE) 50 MCG/ACT nasal spray, Place 1 spray into both nostrils daily., Disp: , Rfl:  ?  Fluticasone-Umeclidin-Vilant (TRELEGY ELLIPTA) 100-62.5-25 MCG/ACT AEPB, Inhale 1 puff into the lungs daily., Disp: 60 each, Rfl: 11 ?  glucose blood (FREESTYLE LITE) test strip, Use to test blood sugar 1 to 3 times daily, Disp: 100 each, Rfl: 11 ?  Insulin Aspart FlexPen (NOVOLOG) 100 UNIT/ML, Up to 15 units plus sliding scale with meals.  Max daily dose 50 units, Disp: 15 mL, Rfl: 6 ?  insulin degludec (TRESIBA FLEXTOUCH) 100 UNIT/ML FlexTouch Pen, Inject 5 Units subcutaneously once daily .  Titrate based on your sugar to a max daily dose of 50 units., Disp: 15 mL, Rfl: 6 ?  insulin lispro (HUMALOG) 100 UNIT/ML KwikPen, Inject up to 15 units plus sliding scale with meals.  Max daily dose 50 units, Disp: 15 mL, Rfl: 6 ?  Insulin Pen Needle (UNIFINE PENTIPS) 32G X 4 MM MISC, use daily as directed, Disp: 100 each, Rfl: 4 ?  loratadine (CLARITIN) 10 MG tablet, Take 10 mg by mouth daily., Disp: , Rfl:  ?  meloxicam  (MOBIC) 15 MG tablet, Take 1 tablet (15 mg total) by mouth daily., Disp: 30 tablet, Rfl: 3 ?  montelukast (SINGULAIR) 10 MG tablet, Take 1 tablet (10 mg total) by mouth daily., Disp: 90 tablet, Rfl: 3 ?  Peak Flow Meter DEVI, Use to monitor peak flow daily, Disp: 1 each, Rfl: 0 ?  tretinoin (RETIN-A) 0.05 % cream, Apply topically at bedtime., Disp: 45 g, Rfl: 11 ? ?Observations/Objective: ?Patient is well-developed, well-nourished in no acute distress.  ?Resting comfortably  ?Head is normocephalic, atraumatic.  ?No labored breathing.  ?Speech is clear and coherent with logical content.  ?Patient is alert and oriented at baseline.  ? ? ?Assessment and Plan: ?1. Acute bacterial bronchitis ?- azithromycin (ZITHROMAX) 250 MG tablet; Take 2 tablets on day 1, then 1 tablet daily on days  2 through 5  Dispense: 6 tablet; Refill: 0 ?- Fluticasone-Umeclidin-Vilant (TRELEGY ELLIPTA) 100-62.5-25 MCG/ACT AEPB; Inhale 1 puff into the lungs daily.  Dispense: 60 each; Refill: 11 ?- benzonatate (TESSALON) 100 MG capsule; Take 1 capsule (100 mg total) by mouth 2 (two) times daily as needed for cough.  Dispense: 90 capsule; Refill: 1 ?- albuterol (VENTOLIN HFA) 108 (90 Base) MCG/ACT inhaler; Inhale 2 puffs into the lungs every 6 (six) hours as needed for wheezing or shortness of breath.  Dispense: 18 g; Refill: 0 ? ?- Worsening symptoms despite OTC medications and albuterol ?- Will treat with Zpack, Trelegy, Albuterol refill, Tessalon perles ?- Avoid oral steroids due to T1DM ?- Push fluids ?- Steam and humidifier can help ?- Seek in person evaluation if not improving or if symptoms worsen ? ?Follow Up Instructions: ?I discussed the assessment and treatment plan with the patient. The patient was provided an opportunity to ask questions and all were answered. The patient agreed with the plan and demonstrated an understanding of the instructions.  A copy of instructions were sent to the patient via MyChart unless otherwise noted below.   ? ? ?The patient was advised to call back or seek an in-person evaluation if the symptoms worsen or if the condition fails to improve as anticipated. ? ?Time:  ?I spent 8 minutes with the patient via telehealth

## 2021-05-01 ENCOUNTER — Other Ambulatory Visit: Payer: Self-pay

## 2021-05-01 ENCOUNTER — Other Ambulatory Visit: Payer: No Typology Code available for payment source

## 2021-05-01 ENCOUNTER — Ambulatory Visit
Admission: RE | Admit: 2021-05-01 | Discharge: 2021-05-01 | Disposition: A | Discharge status: Home / Self Care | Payer: No Typology Code available for payment source | Source: Ambulatory Visit | Attending: Family Medicine | Admitting: Family Medicine

## 2021-05-01 DIAGNOSIS — Z1231 Encounter for screening mammogram for malignant neoplasm of breast: Secondary | ICD-10-CM | POA: Diagnosis present

## 2021-05-01 MED ORDER — FREESTYLE FREEDOM LITE W/DEVICE KIT
PACK | 0 refills | Status: DC
  Filled 2021-05-01: qty 1, 1d supply, fill #0

## 2021-05-01 NOTE — Telephone Encounter (Signed)
Please reorder the meter kit so she can get a second one.

## 2021-05-14 NOTE — Progress Notes (Signed)
? ? ?I,Sulibeya S Dimas,acting as a scribe for Lavon Paganini, MD.,have documented all relevant documentation on the behalf of Lavon Paganini, MD,as directed by  Lavon Paganini, MD while in the presence of Lavon Paganini, MD. ? ? ?Complete physical exam ? ? ?Patient: Tanya Adams   DOB: July 23, 1970   51 y.o. Female  MRN: [id number] ?Visit Date: 05/15/2021 ? ?Today's healthcare provider: Lavon Paganini, MD  ? ?Chief Complaint  ?Patient presents with  ? Annual Exam  ? ?Subjective  ?  ?Tanya Adams is a 51 y.o. female who presents today for a complete physical exam.  ?She reports consuming a diabetic, low calorie diet. Gym/ health club routine includes cardio. She generally feels well. She reports sleeping well. She does not have additional problems to discuss today.  ?HPI  ? ?Pap done 02/20/2021 ?Mammogram done 05/01/2021 ?Colonoscopy done 03/19/14 ? ?Past Medical History:  ?Diagnosis Date  ? Allergic rhinitis   ? Anemia   ? Asthma   ? Dysmenorrhea   ? History of miscarriage   ? Vitamin D deficiency   ? ?Past Surgical History:  ?Procedure Laterality Date  ? BREAST CYST ASPIRATION Right   ? CESAREAN SECTION    ? KNEE ARTHROSCOPY    ? NECK EXPLORATION    ? PARATHYROIDECTOMY    ? TUBAL LIGATION    ? ?Social History  ? ?Socioeconomic History  ? Marital status: Married  ?  Spouse name: Zaelyn Noack  ? Number of children: 2  ? Years of education: 57  ? Highest education level: Doctorate  ?Occupational History  ? Occupation: PHYSICIAN  ?  Employer: [employer]  ?Tobacco Use  ? Smoking status: Never  ? Smokeless tobacco: Never  ?Vaping Use  ? Vaping Use: Never used  ?Substance and Sexual Activity  ? Alcohol use: Not Currently  ? Drug use: Never  ? Sexual activity: Yes  ?  Partners: Male  ?  Birth control/protection: Surgical, None  ?  Comment: Tubal Ligation  ?Other Topics Concern  ? Not on file  ?Social History Narrative  ? Not on file  ? ?Social Determinants of Health  ? ?Financial  Resource Strain: Not on file  ?Food Insecurity: Not on file  ?Transportation Needs: Not on file  ?Physical Activity: Not on file  ?Stress: Not on file  ?Social Connections: Not on file  ?Intimate Partner Violence: Not on file  ? ?Family Status  ?Relation Name Status  ? Mother  Alive  ? Father  Alive  ? Brother  Alive  ? Neg Hx  (Not Specified)  ? ?Family History  ?Problem Relation Age of Onset  ? Hypertension Mother   ? Diabetes Mother   ? Depression Mother   ? Diabetes Father   ? Benign prostatic hyperplasia Father   ? Heart disease Father   ? Healthy Brother   ? Breast cancer Neg Hx   ? Colon cancer Neg Hx   ? Ovarian cancer Neg Hx   ? ?Allergies  ?Allergen Reactions  ? Shellfish Allergy Anaphylaxis  ? Tape Rash  ?  Paper tape is OK  ?  ?Patient Care Team: ?Virginia Crews, MD as PCP - General (Family Medicine)  ? ?Medications: ?Outpatient Medications Prior to Visit  ?Medication Sig  ? albuterol (VENTOLIN HFA) 108 (90 Base) MCG/ACT inhaler Inhale 2 puffs into the lungs every 6 (six) hours as needed for wheezing or shortness of breath.  ? benzonatate (TESSALON) 100 MG capsule Take 1 capsule (100 mg  total) by mouth 2 (two) times daily as needed for cough.  ? Blood Glucose Monitoring Suppl (FREESTYLE FREEDOM LITE) w/Device KIT Use as directed  ? Cholecalciferol (VITAMIN D) 2000 units CAPS Take by mouth.  ? Continuous Blood Gluc Sensor (DEXCOM G6 SENSOR) MISC Use to monitor blood sugar.  Replace every 10 days  ? Continuous Blood Gluc Transmit (DEXCOM G6 TRANSMITTER) MISC Use to monitor blood sugar.  Replace every 3 months  ? fluticasone (FLONASE) 50 MCG/ACT nasal spray Place 1 spray into both nostrils daily.  ? Fluticasone-Umeclidin-Vilant (TRELEGY ELLIPTA) 100-62.5-25 MCG/ACT AEPB Inhale 1 puff into the lungs daily.  ? glucose blood (FREESTYLE LITE) test strip Use to test blood sugar 1 to 3 times daily  ? insulin degludec (TRESIBA FLEXTOUCH) 100 UNIT/ML FlexTouch Pen Inject 5 Units subcutaneously once daily .   Titrate based on your sugar to a max daily dose of 50 units.  ? insulin lispro (HUMALOG) 100 UNIT/ML KwikPen Inject up to 15 units plus sliding scale with meals.  Max daily dose 50 units  ? Insulin Pen Needle (UNIFINE PENTIPS) 32G X 4 MM MISC use daily as directed  ? loratadine (CLARITIN) 10 MG tablet Take 10 mg by mouth daily.  ? meloxicam (MOBIC) 15 MG tablet Take 1 tablet (15 mg total) by mouth daily.  ? montelukast (SINGULAIR) 10 MG tablet Take 1 tablet (10 mg total) by mouth daily.  ? Peak Flow Meter DEVI Use to monitor peak flow daily  ? tretinoin (RETIN-A) 0.05 % cream Apply topically at bedtime.  ? Continuous Blood Gluc Sensor (DEXCOM G7 SENSOR) MISC Use 1 each every 10 (ten) days  ? [DISCONTINUED] Continuous Blood Gluc Transmit (DEXCOM G6 TRANSMITTER) MISC Use to monitor blood sugar.  Replace every 3 months  ? [DISCONTINUED] Insulin Aspart FlexPen (NOVOLOG) 100 UNIT/ML Up to 15 units plus sliding scale with meals.  Max daily dose 50 units  ? ?No facility-administered medications prior to visit.  ? ? ?Review of Systems  ?Constitutional: Negative.   ?HENT: Negative.    ?Eyes: Negative.   ?Respiratory: Negative.    ?Cardiovascular: Negative.   ?Gastrointestinal: Negative.   ?Endocrine: Negative.   ?Genitourinary: Negative.   ?Musculoskeletal: Negative.   ?Skin: Negative.   ?Allergic/Immunologic: Negative.   ?Neurological: Negative.   ?Hematological: Negative.   ?Psychiatric/Behavioral: Negative.    ? ?Last CBC ?Lab Results  ?Component Value Date  ? WBC 5.0 02/17/2021  ? HGB 13.1 02/17/2021  ? HCT 38.9 02/17/2021  ? MCV 88 02/17/2021  ? MCH 29.7 02/17/2021  ? RDW 11.9 02/17/2021  ? PLT 158 02/17/2021  ? ?Last metabolic panel ?Lab Results  ?Component Value Date  ? GLUCOSE 112 (H) 02/17/2021  ? NA 140 02/17/2021  ? K 3.8 02/17/2021  ? CL 101 02/17/2021  ? CO2 24 02/17/2021  ? BUN 15 02/17/2021  ? CREATININE 0.77 02/17/2021  ? EGFR 95 02/17/2021  ? CALCIUM 9.5 02/17/2021  ? PROT 7.1 02/17/2021  ? ALBUMIN 4.7  02/17/2021  ? LABGLOB 2.4 02/17/2021  ? AGRATIO 2.0 02/17/2021  ? BILITOT 0.6 02/17/2021  ? ALKPHOS 46 02/17/2021  ? AST 31 02/17/2021  ? ALT 27 02/17/2021  ? ?Last lipids ?Lab Results  ?Component Value Date  ? CHOL 201 (H) 02/17/2021  ? HDL 79 02/17/2021  ? LDLCALC 116 (H) 02/17/2021  ? TRIG 33 02/17/2021  ? CHOLHDL 2.8 04/08/2020  ? ?Last hemoglobin A1c ?Lab Results  ?Component Value Date  ? HGBA1C 6.4 (H) 05/14/2021  ? ?Last thyroid  functions ?Lab Results  ?Component Value Date  ? TSH 1.480 02/17/2021  ? ?  ? Objective  ? ?  ?BP 110/70 (BP Location: Left Arm, Patient Position: Sitting, Cuff Size: Large)   Pulse 69   Temp 97.7 ?F (36.5 ?C) (Oral)   Resp 16   Ht _0  (1.626 m)   Wt 123 lb (55.8 kg)   SpO2 98%   BMI 21.11 kg/m?  ?BP Readings from Last 3 Encounters:  ?05/15/21 110/70  ?03/13/21 104/68  ?02/20/21 100/60  ? ?Wt Readings from Last 3 Encounters:  ?05/15/21 123 lb (55.8 kg)  ?03/13/21 124 lb 8 oz (56.5 kg)  ?02/20/21 125 lb (56.7 kg)  ? ?  ? ?Physical Exam ?Vitals reviewed.  ?Constitutional:   ?   General: She is not in acute distress. ?   Appearance: Normal appearance. She is well-developed. She is not diaphoretic.  ?HENT:  ?   Head: Normocephalic and atraumatic.  ?   Right Ear: Tympanic membrane, ear canal and external ear normal.  ?   Left Ear: Tympanic membrane, ear canal and external ear normal.  ?   Nose: Nose normal.  ?   Mouth/Throat:  ?   Mouth: Mucous membranes are moist.  ?   Pharynx: Oropharynx is clear. No oropharyngeal exudate.  ?Eyes:  ?   General: No scleral icterus. ?   Conjunctiva/sclera: Conjunctivae normal.  ?   Pupils: Pupils are equal, round, and reactive to light.  ?Neck:  ?   Thyroid: No thyromegaly.  ?Cardiovascular:  ?   Rate and Rhythm: Normal rate and regular rhythm.  ?   Pulses: Normal pulses.  ?   Heart sounds: Normal heart sounds. No murmur heard. ?Pulmonary:  ?   Effort: Pulmonary effort is normal. No respiratory distress.  ?   Breath sounds: Normal breath sounds.  No wheezing or rales.  ?Abdominal:  ?   General: There is no distension.  ?   Palpations: Abdomen is soft.  ?   Tenderness: There is no abdominal tenderness.  ?Musculoskeletal:     ?   General: No deformity.  ?   Cervic

## 2021-05-15 ENCOUNTER — Other Ambulatory Visit: Payer: Self-pay

## 2021-05-15 ENCOUNTER — Ambulatory Visit (INDEPENDENT_AMBULATORY_CARE_PROVIDER_SITE_OTHER): Payer: No Typology Code available for payment source | Admitting: Family Medicine

## 2021-05-15 ENCOUNTER — Encounter: Payer: Self-pay | Admitting: Family Medicine

## 2021-05-15 VITALS — BP 110/70 | HR 69 | Temp 97.7°F | Resp 16 | Ht 64.0 in | Wt 123.0 lb

## 2021-05-15 DIAGNOSIS — J4520 Mild intermittent asthma, uncomplicated: Secondary | ICD-10-CM

## 2021-05-15 DIAGNOSIS — E139 Other specified diabetes mellitus without complications: Secondary | ICD-10-CM | POA: Diagnosis not present

## 2021-05-15 DIAGNOSIS — Z0000 Encounter for general adult medical examination without abnormal findings: Secondary | ICD-10-CM | POA: Diagnosis not present

## 2021-05-15 LAB — MICROALBUMIN / CREATININE URINE RATIO
Creatinine, Urine: 44.9 mg/dL
Microalb/Creat Ratio: <7 mg/g creat (ref 0–29)
Microalbumin, Urine: <3 ug/mL

## 2021-05-15 LAB — HEMOGLOBIN A1C
Est. average glucose Bld gHb Est-mCnc: 137 mg/dL
Hgb A1c MFr Bld: 6.4 % — ABNORMAL HIGH (ref 4.8–5.6)

## 2021-05-15 NOTE — Assessment & Plan Note (Signed)
Chronic and well controlled ?Flare 2 wks ago, but back to baseline ?continue current meds ?

## 2021-05-15 NOTE — Assessment & Plan Note (Signed)
Managed by Endocrinology ?Sugars are well controlled now ?No changes in meds ?Upcoming eye exam ?

## 2021-05-16 ENCOUNTER — Other Ambulatory Visit: Payer: Self-pay

## 2021-05-19 ENCOUNTER — Other Ambulatory Visit: Payer: Self-pay

## 2021-05-22 LAB — HM DIABETES EYE EXAM

## 2021-05-23 ENCOUNTER — Ambulatory Visit (INDEPENDENT_AMBULATORY_CARE_PROVIDER_SITE_OTHER): Payer: No Typology Code available for payment source

## 2021-05-23 DIAGNOSIS — Z23 Encounter for immunization: Secondary | ICD-10-CM | POA: Diagnosis not present

## 2021-06-04 ENCOUNTER — Other Ambulatory Visit: Payer: Self-pay

## 2021-06-29 ENCOUNTER — Encounter: Payer: Self-pay | Admitting: Family Medicine

## 2021-06-30 ENCOUNTER — Other Ambulatory Visit: Payer: Self-pay

## 2021-06-30 MED ORDER — SCOPOLAMINE 1 MG/3DAYS TD PT72
1.0000 | MEDICATED_PATCH | TRANSDERMAL | 0 refills | Status: DC
Start: 2021-06-30 — End: 2021-11-27
  Filled 2021-06-30: qty 3, 9d supply, fill #0

## 2021-07-01 ENCOUNTER — Other Ambulatory Visit: Payer: Self-pay | Admitting: Family Medicine

## 2021-07-01 ENCOUNTER — Other Ambulatory Visit: Payer: Self-pay

## 2021-07-01 MED ORDER — TRESIBA FLEXTOUCH 100 UNIT/ML ~~LOC~~ SOPN
PEN_INJECTOR | SUBCUTANEOUS | 6 refills | Status: DC
Start: 2021-07-01 — End: 2022-03-05
  Filled 2021-07-01: qty 15, 30d supply, fill #0
  Filled 2022-02-02: qty 15, 30d supply, fill #1

## 2021-07-01 NOTE — Telephone Encounter (Signed)
Requested Prescriptions  Pending Prescriptions Disp Refills  . insulin degludec (TRESIBA FLEXTOUCH) 100 UNIT/ML FlexTouch Pen 15 mL 6    Sig: Inject 5 Units subcutaneously once daily .  Titrate based on your sugar to a max daily dose of 50 units.     Endocrinology:  Diabetes - Insulins Passed - 07/01/2021 11:10 AM      Passed - HBA1C is between 0 and 7.9 and within 180 days    Hgb A1c MFr Bld  Date Value Ref Range Status  05/14/2021 6.4 (H) 4.8 - 5.6 % Final    Comment:             Prediabetes: 5.7 - 6.4          Diabetes: >6.4          Glycemic control for adults with diabetes: <7.0          Passed - Valid encounter within last 6 months    Recent Outpatient Visits          1 month ago Encounter for annual physical exam   [facility], Dionne Bucy, MD   3 months ago [facility], Dionne Bucy, MD   9 months ago Stress fracture of pelvis, initial encounter   [facility] Montel Culver, MD   1 year ago Encounter for annual physical exam   [facility] [place], Dionne Bucy, MD   1 year ago [facility], Dionne Bucy, MD      Future Appointments            In 8 months [place], Dionne Bucy, MD [facility], [place]

## 2021-08-26 ENCOUNTER — Other Ambulatory Visit: Payer: Self-pay

## 2021-08-27 ENCOUNTER — Other Ambulatory Visit: Payer: Self-pay

## 2021-08-28 ENCOUNTER — Other Ambulatory Visit: Payer: Self-pay

## 2021-09-04 ENCOUNTER — Other Ambulatory Visit: Payer: Self-pay

## 2021-09-04 ENCOUNTER — Other Ambulatory Visit: Payer: Self-pay | Admitting: Family Medicine

## 2021-09-04 MED FILL — Glucose Blood Test Strip: 30 days supply | Qty: 100 | Fill #0 | Status: AC

## 2021-09-05 ENCOUNTER — Other Ambulatory Visit: Payer: Self-pay

## 2021-09-08 ENCOUNTER — Telehealth: Payer: No Typology Code available for payment source | Admitting: Nurse Practitioner

## 2021-09-08 ENCOUNTER — Other Ambulatory Visit: Payer: Self-pay

## 2021-09-08 DIAGNOSIS — N3000 Acute cystitis without hematuria: Secondary | ICD-10-CM | POA: Diagnosis not present

## 2021-09-08 MED ORDER — CEPHALEXIN 500 MG PO CAPS
500.0000 mg | ORAL_CAPSULE | Freq: Two times a day (BID) | ORAL | 0 refills | Status: AC
  Filled 2021-09-08: qty 14, 7d supply, fill #0

## 2021-09-08 NOTE — Progress Notes (Signed)
E-Visit for Urinary Problems  We are sorry that you are not feeling well.  Here is how we plan to help!  Based on what you shared with me it looks like you most likely have a simple urinary tract infection.  A UTI (Urinary Tract Infection) is a bacterial infection of the bladder.  Most cases of urinary tract infections are simple to treat but a key part of your care is to encourage you to drink plenty of fluids and watch your symptoms carefully.  I have prescribed Keflex 500 mg twice a day for 7 days.  Your symptoms should gradually improve. Call us if the burning in your urine worsens, you develop worsening fever, back pain or pelvic pain or if your symptoms do not resolve after completing the antibiotic.  Urinary tract infections can be prevented by drinking plenty of water to keep your body hydrated.  Also be sure when you wipe, wipe from front to back and don't hold it in!  If possible, empty your bladder every 4 hours.  HOME CARE Drink plenty of fluids Compete the full course of the antibiotics even if the symptoms resolve Remember, when you need to go.go. Holding in your urine can increase the likelihood of getting a UTI! GET HELP RIGHT AWAY IF: You cannot urinate You get a high fever Worsening back pain occurs You see blood in your urine You feel sick to your stomach or throw up You feel like you are going to pass out  MAKE SURE YOU  Understand these instructions. Will watch your condition. Will get help right away if you are not doing well or get worse.   Thank you for choosing an e-visit.  Your e-visit answers were reviewed by a board certified advanced clinical practitioner to complete your personal care plan. Depending upon the condition, your plan could have included both over the counter or prescription medications.  Please review your pharmacy choice. Make sure the pharmacy is open so you can pick up prescription now. If there is a problem, you may contact your  provider through [employer] and have the prescription routed to another pharmacy.  Your safety is important to [place]. If you have drug allergies check your prescription carefully.   For the next 24 hours you can use MyChart to ask questions about today's visit, request a non-urgent call back, or ask for a work or school excuse. You will get an email in the next two days asking about your experience. I hope that your e-visit has been valuable and will speed your recovery.   Meds ordered this encounter  Medications   cephALEXin (KEFLEX) 500 MG capsule    Sig: Take 1 capsule (500 mg total) by mouth 2 (two) times daily for 7 days.    Dispense:  14 capsule    Refill:  0    I spent approximately 5 minutes reviewing the patient's history, current symptoms and coordinating their plan of care today.

## 2021-09-18 ENCOUNTER — Ambulatory Visit: Payer: No Typology Code available for payment source | Admitting: Family Medicine

## 2021-09-22 ENCOUNTER — Other Ambulatory Visit: Payer: Self-pay

## 2021-09-22 MED ORDER — UNIFINE PENTIPS 32G X 4 MM MISC
4 refills | Status: DC
  Filled 2021-09-22: qty 100, 90d supply, fill #0

## 2021-09-25 ENCOUNTER — Ambulatory Visit: Payer: No Typology Code available for payment source | Admitting: Family Medicine

## 2021-09-25 ENCOUNTER — Encounter: Payer: Self-pay | Admitting: Family Medicine

## 2021-09-25 VITALS — BP 118/68 | HR 66 | Ht 64.0 in | Wt 125.0 lb

## 2021-09-25 DIAGNOSIS — M7632 Iliotibial band syndrome, left leg: Secondary | ICD-10-CM | POA: Diagnosis not present

## 2021-09-25 NOTE — Assessment & Plan Note (Signed)
Patient with several day history of left lateral hip pain, atraumatic in onset, in the setting of regular conditioning and ongoing training for [place], training began roughly 2+ months prior.  She is highly active at baseline.  States that left lateral hip pain began after a day of aggressive glute bridges, hip/core conditioning.  Pain then noted with motions, positional change, stretching with side bends, pain does not radiate, no paresthesias.  Began to note mild pubic symphysis and contralateral knee pain which she feels is secondary to altered gait as she did have antalgic gait while symptomatic.  Examination reveals negative straight leg raise, negative FADIR, positive FABER, equivocal piriformis, positive Ober's test, tenderness along the iliac crest primarily anteriorly.  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  She has been dosing Celebrex 100 mg sporadically, I have advised once daily scheduled dosing x7 days then as needed, home-based exercises provided with a focus on flexibility throughout the IT band, lastly a referral to PT for dry needling has been written.  She can contact us for any recalcitrant symptoms despite adherence to this plan, and otherwise follow-up as needed.

## 2021-09-25 NOTE — Patient Instructions (Signed)
-   Dose Celebrex 200 mg once daily x7 days then as needed - Can continue symptom control with topical diclofenac - Start home exercise with information provided, focus on slow and steady advance (include both sides to maintain symmetry) - Referral placed to PT for dry needling - Can continue activity as tolerated using hip symptoms as a guide - Contact us for any worsening symptoms or symptoms that fail to adequately improve over the next 2-4 weeks - Otherwise, contact for questions and follow-up as needed

## 2021-09-25 NOTE — Progress Notes (Signed)
     Primary Care / Sports Medicine Office Visit  Patient Information:  Patient ID: Tanya Adams, female DOB: 28-Aug-1970 Age: 51 y.o. MRN: [id number]   Tanya Adams is a pleasant 51 y.o. female presenting with the following:  Chief Complaint  Patient presents with   Hip Pain    Pt is training for a marathon, noticed some swelling on left hip. Is going to chiropractor with help, and taking meloxicam     Vitals:   09/25/21 1343  BP: 118/68  Pulse: 66  SpO2: 99%   Vitals:   09/25/21 1343  Weight: 125 lb (56.7 kg)  Height: '5\' 4"'$  (1.626 m)   Body mass index is 21.46 kg/m.  No results found.   Independent interpretation of notes and tests performed by another provider:   None  Procedures performed:   None  Pertinent History, Exam, Impression, and Recommendations:   Problem List Items Addressed This Visit       Musculoskeletal and Integument   It band syndrome, left - Primary    Patient with several day history of left lateral hip pain, atraumatic in onset, in the setting of regular conditioning and ongoing training for [employer], training began roughly 2+ months prior.  She is highly active at baseline.  States that left lateral hip pain began after a day of aggressive glute bridges, hip/core conditioning.  Pain then noted with motions, positional change, stretching with side bends, pain does not radiate, no paresthesias.  Began to note mild pubic symphysis and contralateral knee pain which she feels is secondary to altered gait as she did have antalgic gait while symptomatic.  Examination reveals negative straight leg raise, negative FADIR, positive FABER, equivocal piriformis, positive Ober's test, tenderness along the iliac crest primarily anteriorly.  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  She has been dosing Celebrex 100 mg sporadically, I have advised once daily scheduled dosing x7  days then as needed, home-based exercises provided with a focus on flexibility throughout the IT band, lastly a referral to PT for dry needling has been written.  She can contact us for any recalcitrant symptoms despite adherence to this plan, and otherwise follow-up as needed.      Relevant Orders   Ambulatory referral to Physical Therapy     Orders & Medications No orders of the defined types were placed in this encounter.  Orders Placed This Encounter  Procedures   Ambulatory referral to Physical Therapy     No follow-ups on file.     Montel Culver, MD   [facility]

## 2021-10-01 LAB — HEMOGLOBIN A1C
Est. average glucose Bld gHb Est-mCnc: 137 mg/dL
Hgb A1c MFr Bld: 6.4 % — ABNORMAL HIGH (ref 4.8–5.6)

## 2021-10-02 ENCOUNTER — Other Ambulatory Visit: Payer: Self-pay

## 2021-10-02 MED ORDER — INSULIN PEN NEEDLE 32G X 4 MM MISC
3 refills | Status: DC
  Filled 2021-10-02: qty 500, 90d supply, fill #0

## 2021-10-02 MED ORDER — PRECISION QID TEST VI STRP
ORAL_STRIP | 3 refills | Status: AC
  Filled 2021-10-02: qty 300, 90d supply, fill #0

## 2021-10-09 ENCOUNTER — Telehealth: Payer: No Typology Code available for payment source | Admitting: Family Medicine

## 2021-10-09 ENCOUNTER — Other Ambulatory Visit: Payer: Self-pay

## 2021-10-09 DIAGNOSIS — B3731 Acute candidiasis of vulva and vagina: Secondary | ICD-10-CM | POA: Diagnosis not present

## 2021-10-09 MED ORDER — FLUCONAZOLE 150 MG PO TABS
150.0000 mg | ORAL_TABLET | Freq: Once | ORAL | 0 refills | Status: AC
  Filled 2021-10-09: qty 1, 1d supply, fill #0

## 2021-10-09 NOTE — Progress Notes (Signed)

## 2021-11-27 ENCOUNTER — Encounter: Payer: Self-pay | Admitting: *Deleted

## 2021-11-27 ENCOUNTER — Encounter: Payer: No Typology Code available for payment source | Attending: "Endocrinology | Admitting: *Deleted

## 2021-11-27 VITALS — BP 108/60 | Ht 64.0 in | Wt 126.0 lb

## 2021-11-27 DIAGNOSIS — E109 Type 1 diabetes mellitus without complications: Secondary | ICD-10-CM

## 2021-11-27 NOTE — Patient Instructions (Signed)
Check blood sugars before meals, 2 hours after meals, bedtime and as needed with Dexcom G6  Exercise: Continue program for    40-120  minutes   7  days a week  Eat 3 meals day,   3  snacks a day Space meals 4-6 hours apart Record grams of carbohydrates with meals, BG results and insulin amounts to help determine I:C ratio  Carry fast acting glucose and a snack at all times Rotate injection sites

## 2021-11-28 NOTE — Progress Notes (Signed)
Diabetes Self-Management Education  Visit Type: First/Initial  Appt. Start Time: 1315 Appt. End Time: 1435  11/27/2021  [name], identified by name and date of birth, is a 51 y.o. female with a diagnosis of Diabetes: Type 1.   ASSESSMENT  Blood pressure 108/60, height '5\' 4"'$  (1.626 m), weight 126 lb (57.2 kg). Body mass index is 21.63 kg/m.   Diabetes Self-Management Education - 11/27/21 1451       Visit Information   Visit Type First/Initial      Initial Visit   Diabetes Type Type 1    Date Diagnosed 18 months    Are you currently following a meal plan? No   She was doing Keto for a few months when training for [employer]. Now eating more carbs.   Are you taking your medications as prescribed? Yes      Health Coping   How would you rate your overall health? Good      Psychosocial Assessment   Patient Belief/Attitude about Diabetes Motivated to manage diabetes   "it is not pleasant"   What is the hardest part about your diabetes right now, causing you the most concern, or is the most worrisome to you about your diabetes?   Making healty food and beverage choices;Other (comment)   carb counting   Self-care barriers None    Self-management support Doctor's office;Family;Internet communities   My Fitness Pal   Patient Concerns Nutrition/Meal planning;Glycemic Control;Other (comment)   continue running   Special Needs None    Preferred Learning Style Visual;Hands on;Auditory    Learning Readiness Change in progress    How often do you need to have someone help you when you read instructions, pamphlets, or other written materials from your doctor or pharmacy? 1 - Never    What is the last grade level you completed in school? 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 Demonstrates understanding / competency    Patient understands incorporating nutritional management into lifestyle.  Comprehends key points    Patient undertands incorporating physical activity into lifestyle. Demonstrates understanding / competency    Patient understands using medications safely. Comprehends key points    Patient understands monitoring blood glucose, interpreting and using results Demonstrates understanding / competency    Patient understands prevention, detection, and treatment of acute complications. Comprehends key points    Patient understands prevention, detection, and treatment of chronic complications. Demonstrates understanding / competency    Patient understands how to develop strategies to address psychosocial issues. Demonstrates understanding / competency    Patient understands how to develop strategies to promote health/change behavior. Demonstrates understanding / competency      Complications   Last HgB A1C per patient/outside source 6.4 %   09/20/2021   How often do you check your blood sugar? > 4 times/day   Dexcom G7 - 30 day avg 138 mg/dL, GMI 6.6%; Very High 2%, High 12%, In Range 85%, Low <1%, Very Low <1%; 90 day avg 129 mg/dL, GMI 6.4%, Very High <1%, High 8%, In Range 90%, Low 1%, Very Low <1%   Fasting Blood glucose range (mg/dL) --   Pt reports FBG's 100's mg/dL   Number of hypoglycemic episodes per month 4    Can you tell when your blood sugar is low? Yes   tingling in lips - usually associated with exercise   What do you do if your blood sugar  is low? "get something sweet"    Number of hyperglycemic episodes ( >'200mg'$ /dL): Occasional    Can you tell when your blood sugar is high? Yes   looking at phone for Dexcom   What do you do if your blood sugar is high? nothing if it comes down in a short amount of time    Have you had a dilated eye exam in the past 12 months? Yes    Have you had a dental exam in the past 12 months? Yes    Are you checking your feet? No      Dietary Intake   Breakfast vanilla yogurt with pecans or almonds, blueberries or strawberries; egg  bites; 1/2 banana with peanut butter pretzels; English muffin with cream cheese and jelly; egg and spinach; mini bagel with peanut butter; 1/2 muffin; Smoothie with milk, kale, berries, peanut butter powder    Snack (morning) reports 3 snacks/day - egg bites, cheese, fruit - apple or grapes, nuts, peanut butter    Lunch salads with spinach, salmon, feta cheese; cheddar and broccoli soup and bread; cheese steak    Dinner chicken, beef; sweet potatoes, beans, quinoa, broccoli, spinach, squash, zucchini    Snack (evening) sometimes a few grapes before bed or hot chocolate with coconut milk      Activity / Exercise   Activity / Exercise Type Strenuous (running)   runs marathons, spin, bike, yoga, strength training   How many days per week do you exercise? 7    How many minutes per day do you exercise? 80    Total minutes per week of exercise 560      Patient Education   Previous Diabetes Education Yes (please comment)   medical school   Disease Pathophysiology Definition of diabetes, type 1 and 2, and the diagnosis of diabetes;Explored patient's options for treatment of their diabetes    Healthy Eating Role of diet in the treatment of diabetes and the relationship between the three main macronutrients and blood glucose level;Food label reading, portion sizes and measuring food.;Carbohydrate counting;Reviewed blood glucose goals for pre and post meals and how to evaluate the patients' food intake on their blood glucose level.;Meal timing in regards to the patients' current diabetes medication.   Discussed I:C Ratio methods and CF   Being Active Identified with patient nutritional and/or medication changes necessary with exercise.   Reasons for hyperglycemia after exercise.   Medications Taught/reviewed insulin/injectables, injection, site rotation, insulin/injectables storage and needle disposal.;Reviewed patients medication for diabetes, action, purpose, timing of dose and side effects.    Monitoring  Taught/evaluated CGM (comment);Taught/discussed recording of test results and interpretation of SMBG.;Identified appropriate SMBG and/or A1C goals. She reports that she will be starting the pump in January.    Acute complications Taught prevention, symptoms, and  treatment of hypoglycemia - the 15 rule.    Chronic complications Relationship between chronic complications and blood glucose control    Diabetes Stress and Support Identified and addressed patients feelings and concerns about diabetes      Individualized Goals (developed by patient)   Reducing Risk Other (comment)   improve blood sugars, continue running     Post-Education Assessment   Patient understands the diabetes disease and treatment process. Demonstrates understanding / competency    Patient understands incorporating nutritional management into lifestyle. Comprehends key points    Patient undertands incorporating physical activity into lifestyle. Demonstrates understanding / competency    Patient understands using medications safely. Demonstrates understanding / competency  Patient understands monitoring blood glucose, interpreting and using results Demonstrates understanding / competency    Patient understands prevention, detection, and treatment of acute complications. Demonstrates understanding / competency    Patient understands prevention, detection, and treatment of chronic complications. Demonstrates understanding / competency    Patient understands how to develop strategies to address psychosocial issues. Demonstrates understanding / competency    Patient understands how to develop strategies to promote health/change behavior. Demonstrates understanding / competency      Outcomes   Expected Outcomes Demonstrated interest in learning. Expect positive outcomes    Program Status Completed         Individualized Plan for Diabetes Self-Management Training:   Learning Objective:  Patient will have a greater  understanding of diabetes self-management. Patient education plan is to attend individual and/or group sessions per assessed needs and concerns.   Plan:   Patient Instructions  Check blood sugars before meals, 2 hours after meals, bedtime and as needed with Dexcom G6  Exercise: Continue program for    40-120  minutes   7  days a week  Eat 3 meals day,   3  snacks a day Space meals 4-6 hours apart Record grams of carbohydrates with meals, BG results and insulin amounts to help determine I:C ratio  Carry fast acting glucose and a snack at all times Rotate injection sites  Expected Outcomes:  Demonstrated interest in learning. Expect positive outcomes  Education material provided:  General Meal Planning Guidelines Simple Meal Plan Glucose tablets Symptoms, causes and treatments of Hypoglycemia Advanced Insulin Management:Using Insulin-to-Carb Ratios and Correction Factors (Academy of Nutrition and Dietetics) Advanced Carbohydrate Counting (ADA) I:C Ratio and CF cheat sheet Websites and Apps Making Real-Time Insulin Adjustments During Exercise (JDRF)  If problems or questions, patient to contact team via:   Johny Drilling, RN, [place] [phone]  Future DSME appointment: PRN

## 2021-12-09 ENCOUNTER — Other Ambulatory Visit: Payer: Self-pay

## 2021-12-17 ENCOUNTER — Other Ambulatory Visit: Payer: Self-pay

## 2021-12-18 ENCOUNTER — Other Ambulatory Visit: Payer: Self-pay

## 2021-12-23 ENCOUNTER — Encounter: Payer: Self-pay | Admitting: Family Medicine

## 2021-12-23 DIAGNOSIS — E139 Other specified diabetes mellitus without complications: Secondary | ICD-10-CM

## 2021-12-23 DIAGNOSIS — E892 Postprocedural hypoparathyroidism: Secondary | ICD-10-CM

## 2021-12-23 NOTE — Telephone Encounter (Signed)
Ok to order urine microalbumin to creatinine ratio, A1c, CMP, lipid, TSH (use her diabetes diagnosis). We can offer her one of the open slots on Thursday or Friday if she wants it.

## 2021-12-23 NOTE — Progress Notes (Unsigned)
   I,Mardi Cannady S Verdun Rackley,acting as a Education administrator for Lavon Paganini, MD.,have documented all relevant documentation on the behalf of Lavon Paganini, MD,as directed by  Lavon Paganini, MD while in the presence of Lavon Paganini, MD.     Established patient visit   Patient: Tanya Adams   DOB: [birth year]   51 y.o. Female  MRN: [id number] Visit Date: 12/25/2021  Today's healthcare provider: Lavon Paganini, MD   No chief complaint on file.  Subjective    HPI  Patient here today C/O joint pain and stiffness.   Medications: Outpatient Medications Prior to Visit  Medication Sig   albuterol (VENTOLIN HFA) 108 (90 Base) MCG/ACT inhaler Inhale 2 puffs into the lungs every 6 (six) hours as needed for wheezing or shortness of breath. (Patient not taking: Reported on 11/27/2021)   benzonatate (TESSALON) 100 MG capsule Take 1 capsule (100 mg total) by mouth 2 (two) times daily as needed for cough. (Patient not taking: Reported on 11/27/2021)   Cholecalciferol (VITAMIN D) 2000 units CAPS Take 2,000 Units by mouth daily.   Continuous Blood Gluc Sensor (DEXCOM G7 SENSOR) MISC Use 1 each every 10 (ten) days   Continuous Blood Gluc Sensor (DEXCOM G7 SENSOR) MISC Use 1 each every 10 (ten) days   fluticasone (FLONASE) 50 MCG/ACT nasal spray Place 1 spray into both nostrils daily. (Patient not taking: Reported on 11/27/2021)   glucose blood (FREESTYLE LITE) test strip Use to test blood sugar 1 to 3 times daily   glucose blood (PRECISION QID TEST) test strip 1 each (1 strip total) 3 (three) times daily FREEDOM FREESTYLE LITE TEST STRIPS   insulin degludec (TRESIBA FLEXTOUCH) 100 UNIT/ML FlexTouch Pen Inject 5 Units subcutaneously once daily .  Titrate based on your sugar to a max daily dose of 50 units. (Patient taking differently: Inject 10 Units into the skin at bedtime.)   insulin lispro (HUMALOG) 100 UNIT/ML KwikPen Inject up to 15 units plus sliding scale with meals.  Max daily dose 50 units    Insulin Pen Needle (UNIFINE PENTIPS) 32G X 4 MM MISC Use daily as directed   loratadine (CLARITIN) 10 MG tablet Take 10 mg by mouth daily.   meloxicam (MOBIC) 15 MG tablet Take 1 tablet (15 mg total) by mouth daily. (Patient not taking: Reported on 11/27/2021)   montelukast (SINGULAIR) 10 MG tablet Take 1 tablet (10 mg total) by mouth daily.   Peak Flow Meter DEVI Use to monitor peak flow daily (Patient not taking: Reported on 11/27/2021)   tretinoin (RETIN-A) 0.05 % cream Apply topically at bedtime. (Patient not taking: Reported on 11/27/2021)   No facility-administered medications prior to visit.    Review of Systems  Musculoskeletal:  Positive for arthralgias, joint swelling and myalgias.    {Labs  Heme  Chem  Endocrine  Serology  Results Review (optional):23779}   Objective    There were no vitals taken for this visit. BP Readings from Last 3 Encounters:  11/27/21 108/60  09/25/21 118/68  05/15/21 110/70   Wt Readings from Last 3 Encounters:  11/27/21 126 lb (57.2 kg)  09/25/21 125 lb (56.7 kg)  05/15/21 123 lb (55.8 kg)      Physical Exam  ***  No results found for any visits on 12/25/21.  Assessment & Plan     ***  No follow-ups on file.      {provider attestation***:1}   Lavon Paganini, MD  [facility] [phone] (phone) [phone] (fax)  [place]

## 2021-12-25 ENCOUNTER — Encounter: Payer: Self-pay | Admitting: Family Medicine

## 2021-12-25 ENCOUNTER — Other Ambulatory Visit: Payer: Self-pay

## 2021-12-25 ENCOUNTER — Ambulatory Visit (INDEPENDENT_AMBULATORY_CARE_PROVIDER_SITE_OTHER): Payer: No Typology Code available for payment source | Admitting: Family Medicine

## 2021-12-25 VITALS — BP 100/57 | HR 64 | Temp 97.8°F | Resp 16 | Wt 128.2 lb

## 2021-12-25 DIAGNOSIS — M2550 Pain in unspecified joint: Secondary | ICD-10-CM | POA: Diagnosis not present

## 2021-12-25 DIAGNOSIS — F4323 Adjustment disorder with mixed anxiety and depressed mood: Secondary | ICD-10-CM | POA: Diagnosis not present

## 2021-12-25 DIAGNOSIS — M2560 Stiffness of unspecified joint, not elsewhere classified: Secondary | ICD-10-CM | POA: Diagnosis not present

## 2021-12-25 MED ORDER — DULOXETINE HCL 20 MG PO CPEP
20.0000 mg | ORAL_CAPSULE | Freq: Every day | ORAL | 3 refills | Status: DC
  Filled 2021-12-25: qty 30, 30d supply, fill #0

## 2021-12-25 NOTE — Assessment & Plan Note (Signed)
New problem Assoc with joint stiffness No effusion, swelling or redness May be related to stress, lack of sleep Patient is able to run marathons still which is reassuring Will do basic Rheum w/u (ESR, CRP, RF, ANA, antiCCP) given known autoimmune condition (LADA) Consider OMM No specific joint for which imaging is indicated Cymbalta as above 

## 2021-12-25 NOTE — Assessment & Plan Note (Signed)
See plan for polyarthralgia

## 2021-12-25 NOTE — Assessment & Plan Note (Signed)
New problem  A lot of stress with her son at this time Will re-connect with therapist Trial of cymbalta 20 mg daily  Discussed potential side effects Discussed that it can take 6-8 weeks to reach full efficacy Contracted for safety - no SI/HI Discussed synergistic effects of medications and therapy

## 2021-12-26 ENCOUNTER — Other Ambulatory Visit: Payer: Self-pay

## 2021-12-30 ENCOUNTER — Encounter: Payer: Self-pay | Admitting: Family Medicine

## 2021-12-30 LAB — LIPID PANEL WITH LDL/HDL RATIO
Cholesterol, Total: 199 mg/dL (ref 100–199)
HDL: 86 mg/dL (ref >39)
LDL Chol Calc (NIH): 107 mg/dL — ABNORMAL HIGH (ref 0–99)
LDL/HDL Ratio: 1.2 ratio (ref 0.0–3.2)
Triglycerides: 32 mg/dL (ref 0–149)
VLDL Cholesterol Cal: 6 mg/dL (ref 5–40)

## 2021-12-30 LAB — MICROALBUMIN / CREATININE URINE RATIO
Creatinine, Urine: 49.4 mg/dL
Microalb/Creat Ratio: <6 mg/g creat (ref 0–29)
Microalbumin, Urine: <3 ug/mL

## 2021-12-30 LAB — COMPREHENSIVE METABOLIC PANEL
ALT: 26 IU/L (ref 0–32)
AST: 34 IU/L (ref 0–40)
Albumin/Globulin Ratio: 1.8 (ref 1.2–2.2)
Albumin: 4.4 g/dL (ref 3.9–4.9)
Alkaline Phosphatase: 52 IU/L (ref 44–121)
BUN/Creatinine Ratio: 13 (ref 9–23)
BUN: 9 mg/dL (ref 6–24)
Bilirubin Total: 0.6 mg/dL (ref 0.0–1.2)
CO2: 25 mmol/L (ref 20–29)
Calcium: 9.3 mg/dL (ref 8.7–10.2)
Chloride: 104 mmol/L (ref 96–106)
Creatinine, Ser: 0.71 mg/dL (ref 0.57–1.00)
Globulin, Total: 2.4 g/dL (ref 1.5–4.5)
Glucose: 96 mg/dL (ref 70–99)
Potassium: 4.1 mmol/L (ref 3.5–5.2)
Sodium: 143 mmol/L (ref 134–144)
Total Protein: 6.8 g/dL (ref 6.0–8.5)
eGFR: 104 mL/min/{1.73_m2} (ref >59)

## 2021-12-30 LAB — HEMOGLOBIN A1C
Est. average glucose Bld gHb Est-mCnc: 134 mg/dL
Hgb A1c MFr Bld: 6.3 % — ABNORMAL HIGH (ref 4.8–5.6)

## 2021-12-30 LAB — TSH: TSH: 1.72 u[IU]/mL (ref 0.450–4.500)

## 2021-12-31 LAB — RHEUMATOID FACTOR: Rheumatoid fact SerPl-aCnc: 17.2 IU/mL — ABNORMAL HIGH (ref <14.0)

## 2021-12-31 LAB — ANA W/REFLEX IF POSITIVE: Anti Nuclear Antibody (ANA): NEGATIVE

## 2021-12-31 LAB — C-REACTIVE PROTEIN: CRP: 2 mg/L (ref 0–10)

## 2021-12-31 LAB — SEDIMENTATION RATE: Sed Rate: 2 mm/hr (ref 0–40)

## 2021-12-31 LAB — CYCLIC CITRUL PEPTIDE ANTIBODY, IGG/IGA: Cyclic Citrullin Peptide Ab: 7 units (ref 0–19)

## 2022-01-07 ENCOUNTER — Other Ambulatory Visit: Payer: Self-pay

## 2022-01-07 ENCOUNTER — Encounter: Payer: Self-pay | Admitting: Family Medicine

## 2022-01-07 ENCOUNTER — Other Ambulatory Visit: Payer: Self-pay | Admitting: Family Medicine

## 2022-01-07 MED ORDER — FLUCONAZOLE 150 MG PO TABS
ORAL_TABLET | ORAL | 0 refills | Status: AC
  Filled 2022-01-07: qty 12, 12d supply, fill #0

## 2022-01-22 ENCOUNTER — Other Ambulatory Visit: Payer: Self-pay

## 2022-01-22 DIAGNOSIS — E109 Type 1 diabetes mellitus without complications: Secondary | ICD-10-CM | POA: Diagnosis not present

## 2022-01-22 MED ORDER — OMNIPOD 5 DEXG7G6 PODS GEN 5 MISC
1.0000 | 6 refills | Status: AC
  Filled 2022-01-22 – 2022-03-02 (×2): qty 10, 30d supply, fill #0
  Filled 2022-04-16: qty 10, 30d supply, fill #1

## 2022-01-22 MED ORDER — DEXCOM G6 SENSOR MISC
12 refills | Status: DC
  Filled 2022-01-22: qty 3, 30d supply, fill #0
  Filled 2022-03-02: qty 3, 30d supply, fill #1

## 2022-01-22 MED ORDER — OMNIPOD 5 DEXG7G6 INTRO GEN 5 KIT
PACK | 0 refills | Status: DC
  Filled 2022-01-22 – 2022-01-29 (×2): qty 10, 30d supply, fill #0
  Filled 2022-01-30: qty 1, 30d supply, fill #0

## 2022-01-22 MED ORDER — DEXCOM G6 TRANSMITTER MISC
3 refills | Status: DC
  Filled 2022-01-22: qty 1, 1d supply, fill #0
  Filled 2022-01-22: qty 1, 90d supply, fill #0
  Filled 2022-01-26: qty 1, 30d supply, fill #0
  Filled 2022-01-28: qty 1, 90d supply, fill #0
  Filled 2022-02-20: qty 1, 90d supply, fill #1
  Filled 2022-07-21 – 2022-12-01 (×2): qty 1, 90d supply, fill #2

## 2022-01-23 ENCOUNTER — Other Ambulatory Visit: Payer: Self-pay

## 2022-01-26 ENCOUNTER — Other Ambulatory Visit: Payer: Self-pay

## 2022-01-27 ENCOUNTER — Other Ambulatory Visit: Payer: Self-pay

## 2022-01-27 MED ORDER — INSULIN LISPRO 100 UNIT/ML IJ SOLN
65.0000 [IU] | Freq: Every day | INTRAMUSCULAR | 12 refills | Status: DC
  Filled 2022-01-27: qty 20, 30d supply, fill #0
  Filled 2022-03-02: qty 20, 30d supply, fill #1
  Filled 2022-04-09: qty 20, 30d supply, fill #2
  Filled 2022-06-01: qty 20, 30d supply, fill #3
  Filled 2022-09-19: qty 20, 30d supply, fill #4
  Filled 2022-12-03: qty 20, 30d supply, fill #5

## 2022-01-28 ENCOUNTER — Other Ambulatory Visit: Payer: Self-pay

## 2022-01-29 ENCOUNTER — Other Ambulatory Visit: Payer: Self-pay

## 2022-01-29 ENCOUNTER — Other Ambulatory Visit (HOSPITAL_COMMUNITY): Payer: Self-pay

## 2022-01-30 ENCOUNTER — Other Ambulatory Visit: Payer: Self-pay

## 2022-02-02 ENCOUNTER — Other Ambulatory Visit: Payer: Self-pay

## 2022-02-03 ENCOUNTER — Other Ambulatory Visit: Payer: Self-pay

## 2022-02-16 ENCOUNTER — Encounter: Payer: Self-pay | Admitting: Family Medicine

## 2022-02-16 ENCOUNTER — Other Ambulatory Visit: Payer: Self-pay | Admitting: Family Medicine

## 2022-02-16 DIAGNOSIS — E892 Postprocedural hypoparathyroidism: Secondary | ICD-10-CM

## 2022-02-16 DIAGNOSIS — Z780 Asymptomatic menopausal state: Secondary | ICD-10-CM

## 2022-02-16 DIAGNOSIS — Z1231 Encounter for screening mammogram for malignant neoplasm of breast: Secondary | ICD-10-CM

## 2022-02-20 ENCOUNTER — Other Ambulatory Visit: Payer: Self-pay

## 2022-02-22 ENCOUNTER — Other Ambulatory Visit: Payer: Self-pay

## 2022-02-23 ENCOUNTER — Other Ambulatory Visit: Payer: Self-pay

## 2022-02-23 ENCOUNTER — Other Ambulatory Visit (HOSPITAL_COMMUNITY): Payer: Self-pay

## 2022-02-26 DIAGNOSIS — F4320 Adjustment disorder, unspecified: Secondary | ICD-10-CM | POA: Diagnosis not present

## 2022-03-02 ENCOUNTER — Other Ambulatory Visit: Payer: Self-pay

## 2022-03-05 ENCOUNTER — Encounter: Payer: Self-pay | Admitting: Family Medicine

## 2022-03-05 ENCOUNTER — Ambulatory Visit (INDEPENDENT_AMBULATORY_CARE_PROVIDER_SITE_OTHER): Payer: 59 | Admitting: Family Medicine

## 2022-03-05 VITALS — BP 110/71 | HR 64 | Temp 98.7°F | Resp 14 | Ht 64.0 in | Wt 129.5 lb

## 2022-03-05 DIAGNOSIS — M2560 Stiffness of unspecified joint, not elsewhere classified: Secondary | ICD-10-CM | POA: Diagnosis not present

## 2022-03-05 DIAGNOSIS — M2550 Pain in unspecified joint: Secondary | ICD-10-CM | POA: Diagnosis not present

## 2022-03-05 DIAGNOSIS — Z0000 Encounter for general adult medical examination without abnormal findings: Secondary | ICD-10-CM

## 2022-03-05 NOTE — Progress Notes (Signed)
I,Sulibeya S Dimas,acting as a Education administrator for Lavon Paganini, MD.,have documented all relevant documentation on the behalf of Lavon Paganini, MD,as directed by  Lavon Paganini, MD while in the presence of Lavon Paganini, MD.    Complete physical exam   Patient: Tanya Adams   DOB: Apr 08, 1970   52 y.o. Female  MRN: [id number] Visit Date: 03/05/2022  Today's healthcare provider: Lavon Paganini, MD   Chief Complaint  Patient presents with   Annual Exam   Subjective    Tanya Adams is a 52 y.o. female who presents today for a complete physical exam.  She reports consuming a general diet. Home exercise routine includes running. She generally feels well. She reports sleeping well. She does not have additional problems to discuss today.  HPI    Past Medical History:  Diagnosis Date   Allergic rhinitis    Anemia    Asthma    Diabetes mellitus without complication ([place])    Dysmenorrhea    History of miscarriage    Vitamin D deficiency    Past Surgical History:  Procedure Laterality Date   BREAST CYST ASPIRATION Right    CESAREAN SECTION     KNEE ARTHROSCOPY     NECK EXPLORATION     PARATHYROIDECTOMY     TUBAL LIGATION     Social History   Socioeconomic History   Marital status: Married    Spouse name: Dealer   Number of children: 2   Years of education: 25   Highest education level: Occupational hygienist History   Occupation: Oceanographer: [employer]  Tobacco Use   Smoking status: Never   Smokeless tobacco: Never  Vaping Use   Vaping Use: Never used  Substance and Sexual Activity   Alcohol use: Not Currently   Drug use: Never   Sexual activity: Yes    Partners: Male    Birth control/protection: Surgical, None    Comment: Tubal Ligation  Other Topics Concern   Not on file  Social History Narrative   Not on file   Social Determinants of Health   Financial Resource Strain: Not on file  Food  Insecurity: Not on file  Transportation Needs: Not on file  Physical Activity: Sufficiently Active (03/11/2017)   Exercise Vital Sign    Days of Exercise per Week: 6 days    Minutes of Exercise per Session: 80 min  Stress: Not on file  Social Connections: Not on file  Intimate Partner Violence: Not on file   Family Status  Relation Name Status   Mother  Alive   Father  Alive   Brother  Alive   Neg Hx  (Not Specified)   Family History  Problem Relation Age of Onset   Hypertension Mother    Diabetes Mother    Depression Mother    Diabetes Father    Benign prostatic hyperplasia Father    Heart disease Father    Healthy Brother    Breast cancer Neg Hx    Colon cancer Neg Hx    Ovarian cancer Neg Hx    Allergies  Allergen Reactions   Shellfish Allergy Anaphylaxis   Tape Rash    Paper tape is OK    Patient Care Team: Virginia Crews, MD as PCP - General (Family Medicine)   Medications: Outpatient Medications Prior to Visit  Medication Sig   albuterol (VENTOLIN HFA) 108 (90 Base) MCG/ACT inhaler Inhale 2 puffs into the lungs every 6 (  six) hours as needed for wheezing or shortness of breath.   benzonatate (TESSALON) 100 MG capsule Take 1 capsule (100 mg total) by mouth 2 (two) times daily as needed for cough.   Cholecalciferol (VITAMIN D) 2000 units CAPS Take 2,000 Units by mouth daily.   Continuous Blood Gluc Sensor (DEXCOM G6 SENSOR) MISC Use to monitor blood sugar.  Replace every 10 days   Continuous Blood Gluc Sensor (DEXCOM G7 SENSOR) MISC Use 1 each every 10 (ten) days   Continuous Blood Gluc Sensor (DEXCOM G7 SENSOR) MISC Use 1 each every 10 (ten) days   Continuous Blood Gluc Transmit (DEXCOM G6 TRANSMITTER) MISC Use to monitor blood sugar.  Replace every 3 months   DULoxetine (CYMBALTA) 20 MG capsule Take 1 capsule (20 mg total) by mouth daily.   fluconazole (DIFLUCAN) 150 MG tablet Take 1 tablet by mouth for vaginal yeast complaints; repeat dose in 4 days if  symptoms remain.   fluticasone (FLONASE) 50 MCG/ACT nasal spray Place 1 spray into both nostrils daily.   glucose blood (PRECISION QID TEST) test strip 1 each (1 strip total) 3 (three) times daily FREEDOM FREESTYLE LITE TEST STRIPS   Insulin Disposable Pump (OMNIPOD 5 G6 PODS, GEN 5,) MISC Inject 1 each into the skin every third day.   insulin lispro (HUMALOG) 100 UNIT/ML injection Inject 0.65 mLs (65 Units total) into the skin daily via insulin pump.   loratadine (CLARITIN) 10 MG tablet Take 10 mg by mouth daily.   meloxicam (MOBIC) 15 MG tablet Take 1 tablet (15 mg total) by mouth daily.   montelukast (SINGULAIR) 10 MG tablet Take 1 tablet (10 mg total) by mouth daily.   Peak Flow Meter DEVI Use to monitor peak flow daily   tretinoin (RETIN-A) 0.05 % cream Apply topically at bedtime.   [DISCONTINUED] glucose blood (FREESTYLE LITE) test strip Use to test blood sugar 1 to 3 times daily   [DISCONTINUED] insulin degludec (TRESIBA FLEXTOUCH) 100 UNIT/ML FlexTouch Pen Inject 5 Units subcutaneously once daily .  Titrate based on your sugar to a max daily dose of 50 units. (Patient taking differently: Inject 10 Units into the skin at bedtime.)   [DISCONTINUED] Insulin Disposable Pump (OMNIPOD 5 G6 INTRO, GEN 5,) KIT Inject 1 each subcutaneously every third day   [DISCONTINUED] insulin lispro (HUMALOG) 100 UNIT/ML KwikPen Inject up to 15 units plus sliding scale with meals.  Max daily dose 50 units   [DISCONTINUED] Insulin Pen Needle (UNIFINE PENTIPS) 32G X 4 MM MISC Use daily as directed   No facility-administered medications prior to visit.    Review of Systems  All other systems reviewed and are negative.     Objective    BP 110/71 (BP Location: Left Arm, Patient Position: Sitting, Cuff Size: Normal)   Pulse 64   Temp 98.7 F (37.1 C) (Temporal)   Resp 14   Ht 5' 4"$  (1.626 m)   Wt 129 lb 8 oz (58.7 kg)   SpO2 98%   BMI 22.23 kg/m     Physical Exam Vitals reviewed.   Constitutional:      General: She is not in acute distress.    Appearance: Normal appearance. She is well-developed. She is not diaphoretic.  HENT:     Head: Normocephalic and atraumatic.  Eyes:     General: No scleral icterus.    Conjunctiva/sclera: Conjunctivae normal.  Neck:     Thyroid: No thyromegaly.  Cardiovascular:     Rate and Rhythm: Normal rate and  regular rhythm.     Heart sounds: Normal heart sounds. No murmur heard. Pulmonary:     Effort: Pulmonary effort is normal. No respiratory distress.     Breath sounds: Normal breath sounds. No wheezing, rhonchi or rales.  Musculoskeletal:     Cervical back: Neck supple.     Right lower leg: No edema.     Left lower leg: No edema.  Lymphadenopathy:     Cervical: No cervical adenopathy.  Skin:    General: Skin is warm and dry.     Findings: No rash.  Neurological:     Mental Status: She is alert and oriented to person, place, and time. Mental status is at baseline.  Psychiatric:        Mood and Affect: Mood normal.        Behavior: Behavior normal.       Last depression screening scores    03/05/2022    1:59 PM 12/25/2021    2:15 PM 11/27/2021    1:24 PM  PHQ 2/9 Scores  PHQ - 2 Score 0 0 0  PHQ- 9 Score 1 3    Last fall risk screening    03/05/2022    1:59 PM  [place] in the past year? 0  Number falls in past yr: 0  Injury with Fall? 0  Risk for fall due to : No Fall Risks  Follow up Falls evaluation completed   Last Audit-C alcohol use screening    03/05/2022    1:59 PM  Alcohol Use Disorder Test (AUDIT)  1. How often do you have a drink containing alcohol? 2  2. How many drinks containing alcohol do you have on a typical day when you are drinking? 0  3. How often do you have six or more drinks on one occasion? 0  AUDIT-C Score 2   A score of 3 or more in women, and 4 or more in men indicates increased risk for alcohol abuse, EXCEPT if all of the points are from question 1   No results  found for any visits on 03/05/22.  Assessment & Plan    Routine Health Maintenance and Physical Exam  Exercise Activities and Dietary recommendations  Goals   None     Immunization History  Administered Date(s) Administered   Hepatitis B 03/17/1992, 04/17/1992, 09/22/1992   Hepatitis B, ADULT 03/17/1992, 04/17/1992, 09/22/1992   Influenza,inj,Quad PF,6+ Mos 10/06/2016, 10/18/2017, 10/18/2021   Influenza-Unspecified 10/02/2014, 10/06/2016, 10/18/2017, 10/18/2018, 10/23/2019, 09/27/2020   MMR 09/21/1996, 08/08/2010   PFIZER(Purple Top)SARS-COV-2 Vaccination 01/22/2019, 02/17/2019, 10/27/2019, 09/27/2020   Pneumococcal Polysaccharide-23 10/18/2017   Td 09/21/1996   Tdap 04/26/2007, 11/23/2018   Varicella 09/21/1996   Yellow Fever 05/07/2016   Zoster Recombinat (Shingrix) 03/21/2021, 05/23/2021    Health Maintenance  Topic Date Due   HIV Screening  Never done   COVID-19 Vaccine (5 - 2023-24 season) 09/12/2021   OPHTHALMOLOGY EXAM  05/23/2022   HEMOGLOBIN A1C  06/30/2022   Diabetic kidney evaluation - eGFR measurement  12/30/2022   Diabetic kidney evaluation - Urine ACR  12/30/2022   FOOT EXAM  03/06/2023   MAMMOGRAM  05/02/2023   COLONOSCOPY (Pts 45-54yr Insurance coverage will need to be confirmed)  03/18/2024   PAP SMEAR-Modifier  02/20/2026   DTaP/Tdap/Td (4 - Td or Tdap) 11/22/2028   INFLUENZA VACCINE  Completed   Hepatitis C Screening  Completed   Zoster Vaccines- Shingrix  Completed   HPV VACCINES  Aged Out  Discussed health benefits of physical activity, and encouraged her to engage in regular exercise appropriate for her age and condition.  Problem List Items Addressed This Visit       Other   Joint stiffness   Relevant Orders   Ambulatory referral to Rheumatology   Polyarthralgia   Relevant Orders   Ambulatory referral to Rheumatology   Other Visit Diagnoses     Encounter for annual physical exam    -  Primary        See description of joint  stiffness and pain from last OV notes - had offered Rheum referral then - will proceed with it today  Return in about 1 year (around 03/06/2023) for CPE.     I, Lavon Paganini, MD, have reviewed all documentation for this visit. The documentation on 03/05/22 for the exam, diagnosis, procedures, and orders are all accurate and complete.   Hilde Churchman, Dionne Bucy, MD, MPH [facility]

## 2022-03-19 DIAGNOSIS — F4320 Adjustment disorder, unspecified: Secondary | ICD-10-CM | POA: Diagnosis not present

## 2022-03-26 DIAGNOSIS — F4320 Adjustment disorder, unspecified: Secondary | ICD-10-CM | POA: Diagnosis not present

## 2022-03-30 ENCOUNTER — Other Ambulatory Visit: Payer: Self-pay

## 2022-04-02 DIAGNOSIS — E109 Type 1 diabetes mellitus without complications: Secondary | ICD-10-CM | POA: Diagnosis not present

## 2022-04-09 ENCOUNTER — Other Ambulatory Visit: Payer: Self-pay

## 2022-04-09 ENCOUNTER — Encounter: Payer: Self-pay | Admitting: Family Medicine

## 2022-04-09 ENCOUNTER — Ambulatory Visit (INDEPENDENT_AMBULATORY_CARE_PROVIDER_SITE_OTHER): Payer: 59 | Admitting: Family Medicine

## 2022-04-09 VITALS — BP 99/61 | HR 58 | Temp 98.1°F | Resp 12 | Wt 129.9 lb

## 2022-04-09 DIAGNOSIS — M2550 Pain in unspecified joint: Secondary | ICD-10-CM

## 2022-04-09 DIAGNOSIS — M25462 Effusion, left knee: Secondary | ICD-10-CM | POA: Diagnosis not present

## 2022-04-09 NOTE — Progress Notes (Signed)
I,Sulibeya S Dimas,acting as a scribe for Lavon Paganini, MD.,have documented all relevant documentation on the behalf of Lavon Paganini, MD,as directed by  Lavon Paganini, MD while in the presence of Lavon Paganini, MD.     Established patient visit   Patient: Tanya Adams   DOB: 09/13/1970   52 y.o. Female  MRN: [id number] Visit Date: 04/09/2022  Today's healthcare provider: Lavon Paganini, MD   Chief Complaint  Patient presents with   Knee Pain   Subjective    HPI  Patient C/O knee pain/swelling left worse than right. Effusion seemed to increase rapidly causing difficulty walking and climbing stairs  Effusion is smaller than it was yesterday and movement is slightly more comfortable  Seeing Rheum in 2 weeks, but afraid effusion will be dgone by that time  Medications: Outpatient Medications Prior to Visit  Medication Sig   albuterol (VENTOLIN HFA) 108 (90 Base) MCG/ACT inhaler Inhale 2 puffs into the lungs every 6 (six) hours as needed for wheezing or shortness of breath.   benzonatate (TESSALON) 100 MG capsule Take 1 capsule (100 mg total) by mouth 2 (two) times daily as needed for cough.   Cholecalciferol (VITAMIN D) 2000 units CAPS Take 2,000 Units by mouth daily.   Continuous Blood Gluc Sensor (DEXCOM G7 SENSOR) MISC Use 1 each every 10 (ten) days   Continuous Blood Gluc Sensor (DEXCOM G7 SENSOR) MISC Use 1 each every 10 (ten) days   Continuous Blood Gluc Transmit (DEXCOM G6 TRANSMITTER) MISC Use to monitor blood sugar.  Replace every 3 months   fluconazole (DIFLUCAN) 150 MG tablet Take 1 tablet by mouth for vaginal yeast complaints; repeat dose in 4 days if symptoms remain.   fluticasone (FLONASE) 50 MCG/ACT nasal spray Place 1 spray into both nostrils daily.   glucose blood (PRECISION QID TEST) test strip 1 each (1 strip total) 3 (three) times daily FREEDOM FREESTYLE LITE TEST STRIPS   Insulin Disposable Pump (OMNIPOD 5 G6 PODS, GEN 5,) MISC Inject 1  each into the skin every third day.   insulin lispro (HUMALOG) 100 UNIT/ML injection Inject 0.65 mLs (65 Units total) into the skin daily via insulin pump.   loratadine (CLARITIN) 10 MG tablet Take 10 mg by mouth daily.   meloxicam (MOBIC) 15 MG tablet Take 1 tablet (15 mg total) by mouth daily.   montelukast (SINGULAIR) 10 MG tablet Take 1 tablet (10 mg total) by mouth daily.   Peak Flow Meter DEVI Use to monitor peak flow daily   tretinoin (RETIN-A) 0.05 % cream Apply topically at bedtime.   DULoxetine (CYMBALTA) 20 MG capsule Take 1 capsule (20 mg total) by mouth daily.   [DISCONTINUED] Continuous Blood Gluc Sensor (DEXCOM G6 SENSOR) MISC Use to monitor blood sugar.  Replace every 10 days   No facility-administered medications prior to visit.    Review of Systems     Objective    BP 99/61 (BP Location: Left Arm, Patient Position: Sitting, Cuff Size: Normal)   Pulse (!) 58   Temp 98.1 F (36.7 C)   Resp 12   Wt 129 lb 14.4 oz (58.9 kg)   BMI 22.30 kg/m    Physical Exam Vitals reviewed.  Constitutional:      General: Tanya Adams is not in acute distress.    Appearance: Tanya Adams is well-developed.  HENT:     Head: Normocephalic and atraumatic.  Eyes:     General: No scleral icterus.    Conjunctiva/sclera: Conjunctivae normal.  Cardiovascular:  Rate and Rhythm: Normal rate and regular rhythm.  Pulmonary:     Effort: Pulmonary effort is normal. No respiratory distress.  Musculoskeletal:     Comments: Small effusion of L knee. ROM intact. No posterior knee swelling or asymmetry  Skin:    General: Skin is warm and dry.     Findings: No rash.  Neurological:     Mental Status: Tanya Adams is alert and oriented to person, place, and time.  Psychiatric:        Behavior: Behavior normal.       No results found for any visits on 04/09/22.  Assessment & Plan     1. Effusion of left knee -New problem - Starting to improve already - Aspiration as below in order to get cell count from  synovial fluid to possibly help with rheumatology workup that is forthcoming - See procedure note below - Sed Rate (ESR) - C-reactive protein - CYCLIC CITRUL PEPTIDE ANTIBODY, IGG/IGA - Rheumatoid Factor - Cell count + diff,  w/ cryst-synvl fld  2. Polyarthralgia - remain concern for possible rheumatologic process - Upcoming appointment with rheumatology - Previous rheumatology labs were normal except for mildly elevated rheumatoid factor - Will repeat labs given that Tanya Adams is currently in a flare to see if that changes things - Sed Rate (ESR) - C-reactive protein - CYCLIC CITRUL PEPTIDE ANTIBODY, IGG/IGA - Rheumatoid Factor    Aspiration: Patient was given informed consent,. Appropriate time out was taken. Area prepped and draped in usual sterile fashion.  4 cc of 1% lidocaine without epinephrine was injected into the L knee using a(n) anterior lateral approach.  Then 1 cc of synovial fluid was aspirated.  Small amount of blood was also aspirated as removing the needle-patient did not have hemarthrosis despite RBCs that will be seen on cell count.  The patient tolerated the procedure well. There were no complications. Post procedure instructions were given.   Return if symptoms worsen or fail to improve.      I, Lavon Paganini, MD, have reviewed all documentation for this visit. The documentation on 04/09/22 for the exam, diagnosis, procedures, and orders are all accurate and complete.   Drezden Seitzinger, Dionne Bucy, MD, MPH [facility]

## 2022-04-10 ENCOUNTER — Telehealth: Payer: Self-pay

## 2022-04-10 ENCOUNTER — Other Ambulatory Visit: Payer: Self-pay

## 2022-04-10 NOTE — Telephone Encounter (Signed)
Leah from [facility] called to let us know that synvl fluid is not enough to be tested.

## 2022-04-11 LAB — RHEUMATOID FACTOR: Rheumatoid fact SerPl-aCnc: 16.2 IU/mL — ABNORMAL HIGH (ref <14.0)

## 2022-04-11 LAB — SEDIMENTATION RATE: Sed Rate: 2 mm/hr (ref 0–40)

## 2022-04-11 LAB — C-REACTIVE PROTEIN: CRP: <1 mg/L (ref 0–10)

## 2022-04-11 LAB — CYCLIC CITRUL PEPTIDE ANTIBODY, IGG/IGA: Cyclic Citrullin Peptide Ab: 6 units (ref 0–19)

## 2022-04-13 ENCOUNTER — Other Ambulatory Visit: Payer: Self-pay

## 2022-04-13 MED ORDER — DEXCOM G6 SENSOR MISC
12 refills | Status: DC
  Filled 2022-04-13: qty 3, 30d supply, fill #0
  Filled 2022-04-28: qty 3, 30d supply, fill #1
  Filled 2022-06-24: qty 3, 30d supply, fill #2
  Filled 2022-07-21: qty 3, 30d supply, fill #3
  Filled 2022-08-24: qty 3, 30d supply, fill #4

## 2022-04-13 NOTE — Telephone Encounter (Signed)
Mychart message sent to patient.

## 2022-04-13 NOTE — Telephone Encounter (Signed)
Ok thanks for letting me know. Please let patient know.

## 2022-04-14 ENCOUNTER — Other Ambulatory Visit: Payer: Self-pay

## 2022-04-16 ENCOUNTER — Other Ambulatory Visit: Payer: Self-pay

## 2022-04-24 ENCOUNTER — Other Ambulatory Visit: Payer: Self-pay

## 2022-04-24 MED ORDER — OMNIPOD 5 DEXG7G6 PODS GEN 5 MISC
11 refills | Status: DC
  Filled 2022-04-24: qty 10, 30d supply, fill #0
  Filled 2022-05-19: qty 30, 90d supply, fill #0
  Filled 2022-08-05: qty 30, 90d supply, fill #1
  Filled 2022-11-06: qty 30, 90d supply, fill #2
  Filled 2023-02-10: qty 30, 90d supply, fill #3

## 2022-04-28 ENCOUNTER — Other Ambulatory Visit: Payer: Self-pay

## 2022-04-29 ENCOUNTER — Other Ambulatory Visit: Payer: Self-pay

## 2022-04-30 ENCOUNTER — Other Ambulatory Visit: Payer: Self-pay

## 2022-04-30 DIAGNOSIS — E559 Vitamin D deficiency, unspecified: Secondary | ICD-10-CM | POA: Diagnosis not present

## 2022-04-30 DIAGNOSIS — M2550 Pain in unspecified joint: Secondary | ICD-10-CM | POA: Diagnosis not present

## 2022-04-30 DIAGNOSIS — R768 Other specified abnormal immunological findings in serum: Secondary | ICD-10-CM | POA: Diagnosis not present

## 2022-04-30 DIAGNOSIS — M791 Myalgia, unspecified site: Secondary | ICD-10-CM | POA: Diagnosis not present

## 2022-04-30 MED ORDER — CELECOXIB 100 MG PO CAPS
100.0000 mg | ORAL_CAPSULE | Freq: Two times a day (BID) | ORAL | 1 refills | Status: AC
  Filled 2022-04-30: qty 180, 90d supply, fill #0
  Filled 2022-12-03: qty 180, 90d supply, fill #1

## 2022-05-05 ENCOUNTER — Other Ambulatory Visit: Payer: Self-pay

## 2022-05-08 ENCOUNTER — Encounter: Payer: Self-pay | Admitting: Family Medicine

## 2022-05-08 DIAGNOSIS — L608 Other nail disorders: Secondary | ICD-10-CM

## 2022-05-13 ENCOUNTER — Encounter: Payer: Self-pay | Admitting: Family Medicine

## 2022-05-13 DIAGNOSIS — M25562 Pain in left knee: Secondary | ICD-10-CM

## 2022-05-13 DIAGNOSIS — G8929 Other chronic pain: Secondary | ICD-10-CM

## 2022-05-14 ENCOUNTER — Ambulatory Visit
Admission: RE | Admit: 2022-05-14 | Discharge: 2022-05-14 | Disposition: A | Discharge status: Home / Self Care | Payer: 59 | Source: Ambulatory Visit | Attending: Family Medicine | Admitting: Family Medicine

## 2022-05-14 DIAGNOSIS — Z780 Asymptomatic menopausal state: Secondary | ICD-10-CM | POA: Insufficient documentation

## 2022-05-14 DIAGNOSIS — Z9089 Acquired absence of other organs: Secondary | ICD-10-CM | POA: Diagnosis not present

## 2022-05-14 DIAGNOSIS — Z13820 Encounter for screening for osteoporosis: Secondary | ICD-10-CM | POA: Diagnosis not present

## 2022-05-14 DIAGNOSIS — Z1231 Encounter for screening mammogram for malignant neoplasm of breast: Secondary | ICD-10-CM | POA: Insufficient documentation

## 2022-05-14 DIAGNOSIS — Z9889 Other specified postprocedural states: Secondary | ICD-10-CM | POA: Diagnosis not present

## 2022-05-18 ENCOUNTER — Ambulatory Visit
Admission: RE | Admit: 2022-05-18 | Discharge: 2022-05-18 | Disposition: A | Discharge status: Home / Self Care | Payer: 59 | Attending: Family Medicine | Admitting: Family Medicine

## 2022-05-18 ENCOUNTER — Ambulatory Visit
Admission: RE | Admit: 2022-05-18 | Discharge: 2022-05-18 | Disposition: A | Discharge status: Home / Self Care | Payer: 59 | Source: Ambulatory Visit | Attending: Family Medicine | Admitting: Family Medicine

## 2022-05-18 DIAGNOSIS — M25562 Pain in left knee: Secondary | ICD-10-CM

## 2022-05-19 ENCOUNTER — Other Ambulatory Visit: Payer: Self-pay

## 2022-05-20 ENCOUNTER — Other Ambulatory Visit: Payer: Self-pay

## 2022-05-21 NOTE — Addendum Note (Signed)
Addended by: Erasmo Downer on: 05/21/2022 11:49 AM   Modules accepted: Orders

## 2022-05-25 ENCOUNTER — Ambulatory Visit
Admission: RE | Admit: 2022-05-25 | Discharge: 2022-05-25 | Disposition: A | Discharge status: Home / Self Care | Payer: 59 | Source: Ambulatory Visit | Attending: Family Medicine | Admitting: Family Medicine

## 2022-05-25 DIAGNOSIS — M25562 Pain in left knee: Secondary | ICD-10-CM | POA: Insufficient documentation

## 2022-05-25 DIAGNOSIS — G8929 Other chronic pain: Secondary | ICD-10-CM | POA: Diagnosis not present

## 2022-05-29 ENCOUNTER — Telehealth: Payer: Self-pay

## 2022-05-29 DIAGNOSIS — G8929 Other chronic pain: Secondary | ICD-10-CM

## 2022-05-29 NOTE — Telephone Encounter (Signed)
-----   Message from Erasmo Downer, MD sent at 05/29/2022  9:28 AM EDT ----- Medial meniscus tear. I'd recommend seeing Ortho - ok to refer her to whoever she'd like to see for this.

## 2022-05-29 NOTE — Telephone Encounter (Signed)
Seen by patient Tanya Adams on 05/29/2022  9:54 AM  referral placed.

## 2022-06-01 ENCOUNTER — Other Ambulatory Visit: Payer: Self-pay

## 2022-06-03 DIAGNOSIS — M25562 Pain in left knee: Secondary | ICD-10-CM | POA: Diagnosis not present

## 2022-06-04 DIAGNOSIS — L603 Nail dystrophy: Secondary | ICD-10-CM | POA: Diagnosis not present

## 2022-06-04 DIAGNOSIS — B351 Tinea unguium: Secondary | ICD-10-CM | POA: Diagnosis not present

## 2022-06-24 ENCOUNTER — Other Ambulatory Visit: Payer: Self-pay

## 2022-06-25 LAB — HM DIABETES EYE EXAM

## 2022-07-21 ENCOUNTER — Other Ambulatory Visit: Payer: Self-pay

## 2022-08-05 ENCOUNTER — Other Ambulatory Visit: Payer: Self-pay

## 2022-08-11 ENCOUNTER — Encounter: Payer: Self-pay | Admitting: Family Medicine

## 2022-08-12 ENCOUNTER — Other Ambulatory Visit: Payer: Self-pay

## 2022-08-12 DIAGNOSIS — E109 Type 1 diabetes mellitus without complications: Secondary | ICD-10-CM | POA: Diagnosis not present

## 2022-08-13 DIAGNOSIS — E109 Type 1 diabetes mellitus without complications: Secondary | ICD-10-CM | POA: Diagnosis not present

## 2022-08-18 ENCOUNTER — Other Ambulatory Visit: Payer: Self-pay

## 2022-08-18 MED ORDER — INSULIN LISPRO 100 UNIT/ML IJ SOLN
65.0000 [IU] | Freq: Every day | INTRAMUSCULAR | 12 refills | Status: DC
  Filled 2022-08-18: qty 20, 30d supply, fill #0
  Filled 2022-09-19 – 2023-02-22 (×2): qty 20, 30d supply, fill #1
  Filled 2023-06-01: qty 20, 30d supply, fill #2
  Filled 2023-06-27: qty 20, 30d supply, fill #3

## 2022-08-24 ENCOUNTER — Other Ambulatory Visit: Payer: Self-pay

## 2022-08-27 ENCOUNTER — Other Ambulatory Visit: Payer: Self-pay

## 2022-08-27 MED ORDER — DEXCOM G6 SENSOR MISC
3 refills | Status: AC
  Filled 2022-09-19: qty 9, 90d supply, fill #0
  Filled 2022-12-23: qty 9, 90d supply, fill #1
  Filled 2023-03-15: qty 9, 90d supply, fill #2
  Filled 2023-06-21 (×2): qty 9, 90d supply, fill #3

## 2022-09-20 ENCOUNTER — Other Ambulatory Visit: Payer: Self-pay

## 2022-09-21 ENCOUNTER — Other Ambulatory Visit: Payer: Self-pay

## 2022-10-28 ENCOUNTER — Encounter: Payer: Self-pay | Admitting: Family Medicine

## 2022-11-06 ENCOUNTER — Other Ambulatory Visit: Payer: Self-pay

## 2022-11-11 DIAGNOSIS — M25562 Pain in left knee: Secondary | ICD-10-CM | POA: Diagnosis not present

## 2022-12-01 ENCOUNTER — Other Ambulatory Visit: Payer: Self-pay

## 2022-12-03 ENCOUNTER — Other Ambulatory Visit: Payer: Self-pay

## 2022-12-15 DIAGNOSIS — E109 Type 1 diabetes mellitus without complications: Secondary | ICD-10-CM | POA: Diagnosis not present

## 2022-12-23 ENCOUNTER — Other Ambulatory Visit: Payer: Self-pay

## 2022-12-24 ENCOUNTER — Other Ambulatory Visit: Payer: Self-pay

## 2022-12-24 DIAGNOSIS — E109 Type 1 diabetes mellitus without complications: Secondary | ICD-10-CM | POA: Diagnosis not present

## 2022-12-24 DIAGNOSIS — E1069 Type 1 diabetes mellitus with other specified complication: Secondary | ICD-10-CM | POA: Diagnosis not present

## 2022-12-24 DIAGNOSIS — E782 Mixed hyperlipidemia: Secondary | ICD-10-CM | POA: Diagnosis not present

## 2022-12-24 MED ORDER — ROSUVASTATIN CALCIUM 5 MG PO TABS
ORAL_TABLET | ORAL | 3 refills | Status: DC
  Filled 2022-12-24: qty 90, 90d supply, fill #0
  Filled 2023-03-18: qty 90, 90d supply, fill #1

## 2022-12-31 ENCOUNTER — Encounter: Payer: Self-pay | Admitting: Family Medicine

## 2022-12-31 NOTE — Telephone Encounter (Signed)
 Care team updated and letter sent for eye exam notes.

## 2023-01-19 ENCOUNTER — Telehealth: Payer: Commercial Managed Care - PPO | Admitting: Physician Assistant

## 2023-01-19 ENCOUNTER — Other Ambulatory Visit: Payer: Self-pay

## 2023-01-19 DIAGNOSIS — J208 Acute bronchitis due to other specified organisms: Secondary | ICD-10-CM | POA: Diagnosis not present

## 2023-01-19 DIAGNOSIS — B9689 Other specified bacterial agents as the cause of diseases classified elsewhere: Secondary | ICD-10-CM

## 2023-01-19 MED ORDER — BENZONATATE 100 MG PO CAPS
100.0000 mg | ORAL_CAPSULE | Freq: Three times a day (TID) | ORAL | 0 refills | Status: AC | PRN
Start: 2023-01-19 — End: ?
  Filled 2023-01-19: qty 30, 5d supply, fill #0

## 2023-01-19 MED ORDER — AZITHROMYCIN 250 MG PO TABS
ORAL_TABLET | ORAL | 0 refills | Status: AC
Start: 2023-01-19 — End: 2023-01-24
  Filled 2023-01-19: qty 6, 5d supply, fill #0

## 2023-01-19 NOTE — Progress Notes (Signed)
 E-Visit for Cough   We are sorry that you are not feeling well.  Here is how we plan to help!  Based on your presentation I believe you most likely have A cough due to bacteria.  When patients have a fever and a productive cough with a change in color or increased sputum production, we are concerned about bacterial bronchitis.  If left untreated it can progress to pneumonia.  If your symptoms do not improve with your treatment plan it is important that you contact your provider.   I have prescribed Azithromyin 250 mg: two tablets now and then one tablet daily for 4 additonal days    In addition you may use A non-prescription cough medication called Mucinex DM: take 2 tablets every 12 hours. and A prescription cough medication called Tessalon Perles 100mg . You may take 1-2 capsules every 8 hours as needed for your cough.  From your responses in the eVisit questionnaire you describe inflammation in the upper respiratory tract which is causing a significant cough.  This is commonly called Bronchitis and has four common causes:   Allergies Viral Infections Acid Reflux Bacterial Infection Allergies, viruses and acid reflux are treated by controlling symptoms or eliminating the cause. An example might be a cough caused by taking certain blood pressure medications. You stop the cough by changing the medication. Another example might be a cough caused by acid reflux. Controlling the reflux helps control the cough.     HOME CARE Only take medications as instructed by your medical team. Complete the entire course of an antibiotic. Drink plenty of fluids and get plenty of rest. Avoid close contacts especially the very young and the elderly Cover your mouth if you cough or cough into your sleeve. Always remember to wash your hands A steam or ultrasonic humidifier can help congestion.   GET HELP RIGHT AWAY IF: You develop worsening fever. You become short of breath You cough up blood. Your symptoms  persist after you have completed your treatment plan MAKE SURE YOU  Understand these instructions. Will watch your condition. Will get help right away if you are not doing well or get worse.    Thank you for choosing an e-visit.  Your e-visit answers were reviewed by a board certified advanced clinical practitioner to complete your personal care plan. Depending upon the condition, your plan could have included both over the counter or prescription medications.  Please review your pharmacy choice. Make sure the pharmacy is open so you can pick up prescription now. If there is a problem, you may contact your provider through [employer] and have the prescription routed to another pharmacy.  Your safety is important to [place]. If you have drug allergies check your prescription carefully.   For the next 24 hours you can use MyChart to ask questions about today's visit, request a non-urgent call back, or ask for a work or school excuse. You will get an email in the next two days asking about your experience. I hope that your e-visit has been valuable and will speed your recovery.    I have spent 5 minutes in review of e-visit questionnaire, review and updating patient chart, medical decision making and response to patient.   Margaretann Loveless, PA-C

## 2023-02-03 ENCOUNTER — Other Ambulatory Visit: Payer: Self-pay

## 2023-02-03 ENCOUNTER — Other Ambulatory Visit: Payer: Self-pay | Admitting: Family Medicine

## 2023-02-03 DIAGNOSIS — J4540 Moderate persistent asthma, uncomplicated: Secondary | ICD-10-CM

## 2023-02-03 MED ORDER — MONTELUKAST SODIUM 10 MG PO TABS
10.0000 mg | ORAL_TABLET | Freq: Every day | ORAL | 0 refills | Status: AC
Start: 2023-02-03 — End: ?
  Filled 2023-02-03: qty 90, 90d supply, fill #0

## 2023-02-03 NOTE — Telephone Encounter (Signed)
Requested Prescriptions  Pending Prescriptions Disp Refills   montelukast (SINGULAIR) 10 MG tablet 90 tablet 3    Sig: Take 1 tablet (10 mg total) by mouth daily.     Pulmonology:  Leukotriene Inhibitors Passed - 02/03/2023  1:19 PM      Passed - Valid encounter within last 12 months    Recent Outpatient Visits           10 months ago Effusion of left knee   [facility] [place], Marzella Schlein, MD   11 months ago Encounter for annual physical exam   [facility] [place], Marzella Schlein, MD   1 year ago Polyarthralgia   [facility] [place], Marzella Schlein, MD   1 year ago It band syndrome, left   [facility], Ocie Bob, MD   1 year ago Encounter for annual physical exam   [facility] [place], Marzella Schlein, MD       Future Appointments             In 1 month [place], Marzella Schlein, MD [facility]

## 2023-02-10 ENCOUNTER — Other Ambulatory Visit: Payer: Self-pay

## 2023-02-11 ENCOUNTER — Encounter: Payer: Self-pay | Admitting: Family Medicine

## 2023-02-22 ENCOUNTER — Other Ambulatory Visit: Payer: Self-pay

## 2023-03-11 ENCOUNTER — Other Ambulatory Visit: Payer: Self-pay

## 2023-03-11 ENCOUNTER — Encounter: Payer: Self-pay | Admitting: Family Medicine

## 2023-03-11 ENCOUNTER — Ambulatory Visit (INDEPENDENT_AMBULATORY_CARE_PROVIDER_SITE_OTHER): Payer: Commercial Managed Care - PPO | Admitting: Family Medicine

## 2023-03-11 VITALS — BP 102/65 | HR 60 | Ht 64.0 in | Wt 126.2 lb

## 2023-03-11 DIAGNOSIS — M25562 Pain in left knee: Secondary | ICD-10-CM

## 2023-03-11 DIAGNOSIS — E109 Type 1 diabetes mellitus without complications: Secondary | ICD-10-CM | POA: Diagnosis not present

## 2023-03-11 DIAGNOSIS — M25462 Effusion, left knee: Secondary | ICD-10-CM | POA: Diagnosis not present

## 2023-03-11 DIAGNOSIS — Z23 Encounter for immunization: Secondary | ICD-10-CM

## 2023-03-11 DIAGNOSIS — B351 Tinea unguium: Secondary | ICD-10-CM | POA: Diagnosis not present

## 2023-03-11 DIAGNOSIS — Z20828 Contact with and (suspected) exposure to other viral communicable diseases: Secondary | ICD-10-CM | POA: Diagnosis not present

## 2023-03-11 DIAGNOSIS — Z7184 Encounter for health counseling related to travel: Secondary | ICD-10-CM

## 2023-03-11 DIAGNOSIS — Z0000 Encounter for general adult medical examination without abnormal findings: Secondary | ICD-10-CM

## 2023-03-11 DIAGNOSIS — E139 Other specified diabetes mellitus without complications: Secondary | ICD-10-CM

## 2023-03-11 DIAGNOSIS — Z0001 Encounter for general adult medical examination with abnormal findings: Secondary | ICD-10-CM | POA: Diagnosis not present

## 2023-03-11 LAB — POC COVID19/FLU A&B COMBO
Covid Antigen, POC: NEGATIVE
Influenza A Antigen, POC: NEGATIVE
Influenza B Antigen, POC: NEGATIVE

## 2023-03-11 MED ORDER — AZITHROMYCIN 250 MG PO TABS
ORAL_TABLET | ORAL | 0 refills | Status: AC
  Filled 2023-03-11: qty 6, 5d supply, fill #0

## 2023-03-11 MED ORDER — PREDNISONE 10 MG PO TABS
ORAL_TABLET | ORAL | 0 refills | Status: AC
  Filled 2023-03-11: qty 21, 6d supply, fill #0

## 2023-03-11 MED ORDER — ONDANSETRON HCL 4 MG PO TABS
4.0000 mg | ORAL_TABLET | Freq: Three times a day (TID) | ORAL | 0 refills | Status: AC | PRN
  Filled 2023-03-11: qty 20, 7d supply, fill #0

## 2023-03-11 MED ORDER — TERBINAFINE HCL 250 MG PO TABS
250.0000 mg | ORAL_TABLET | Freq: Every day | ORAL | 0 refills | Status: AC
  Filled 2023-03-11: qty 90, 90d supply, fill #0

## 2023-03-11 MED ORDER — NITROFURANTOIN MONOHYD MACRO 100 MG PO CAPS
100.0000 mg | ORAL_CAPSULE | Freq: Two times a day (BID) | ORAL | 0 refills | Status: AC
  Filled 2023-03-11: qty 10, 5d supply, fill #0

## 2023-03-11 NOTE — Progress Notes (Signed)
 Complete physical exam   Patient: Tanya Adams   DOB: [birth year]   53 y.o. Female  MRN: [id number] Visit Date: 03/11/2023  Today's healthcare provider: Shirlee Latch, MD   Chief Complaint  Patient presents with   Annual Exam    Last completed  Diet -  Being mindful of carbs Exercise - Running three to 4 days a week along with some strength training or stretching daily with sessions typically a hour a day  Feeling - well  Sleeping - well Concerns - several questions    Care Management    Pneumonia vaccines - yes   Influenza    Recently treated patient who tested positive and her mask was removed while in the room. Now having minor symptoms such as runny nose, sneezing, heavy eyes and minor headache   Subjective    Tanya Adams is a 53 y.o. female who presents today for a complete physical exam.    Discussed the use of AI scribe software for clinical note transcription with the patient, who gave verbal consent to proceed.  History of Present Illness   The patient, a healthcare provider, presents with multiple health concerns. She recently had a potential exposure to COVID-19 and has been experiencing mild symptoms, which she believes could be allergies. She has also been dealing with toenail issues, which she was recommended to treat with a topical medication by Derm. However, she is not satisfied with the cost and is considering oral medication.  The patient has been managing her diabetes with a careful diet and exercise regimen, including running. She reports that her blood sugar levels have been generally well-controlled, although she still experiences occasional spikes. She is also dealing with L knee pain, which she believes may be due to a meniscal horn. She has been considering surgery but has decided to delay it as the pain has not been severe.  The patient is also planning a cruise and is requesting travel medications, including Z-Pak, Macrobid, Zofran, and  prednisone. She also discusses her current medication regimen, which includes Crestor.        Last depression screening scores    03/11/2023    1:41 PM 04/09/2022    1:16 PM 03/05/2022    1:59 PM  PHQ 2/9 Scores  PHQ - 2 Score 0 1 0  PHQ- 9 Score  3 1   Last fall risk screening    03/11/2023    1:42 PM  Fall Risk   Falls in the past year? 0  Number falls in past yr: 0  Injury with Fall? 0  Risk for fall due to : No Fall Risks  Follow up Falls evaluation completed        Medications: Outpatient Medications Prior to Visit  Medication Sig   albuterol (VENTOLIN HFA) 108 (90 Base) MCG/ACT inhaler Inhale 2 puffs into the lungs every 6 (six) hours as needed for wheezing or shortness of breath.   benzonatate (TESSALON) 100 MG capsule Take 1-2 capsules (100-200 mg total) by mouth 3 (three) times daily as needed.   celecoxib (CELEBREX) 100 MG capsule Take 1 capsule (100 mg total) by mouth 2 (two) times daily.   Cholecalciferol (VITAMIN D) 2000 units CAPS Take 2,000 Units by mouth daily.   Continuous Glucose Sensor (DEXCOM G6 SENSOR) MISC Use to monitor blood sugar. Replace every 10 days.   fluconazole (DIFLUCAN) 150 MG tablet Take 1 tablet by mouth for vaginal yeast complaints; repeat dose in 4 days if  symptoms remain.   fluticasone (FLONASE) 50 MCG/ACT nasal spray Place 1 spray into both nostrils daily.   glucose blood (PRECISION QID TEST) test strip 1 each (1 strip total) 3 (three) times daily FREEDOM FREESTYLE LITE TEST STRIPS   insulin lispro (HUMALOG) 100 UNIT/ML injection Inject up to 0.65 mLs (65 Units total) into the skin daily via pump   loratadine (CLARITIN) 10 MG tablet Take 10 mg by mouth daily.   montelukast (SINGULAIR) 10 MG tablet Take 1 tablet (10 mg total) by mouth daily.   Peak Flow Meter DEVI Use to monitor peak flow daily   rosuvastatin (CRESTOR) 5 MG tablet Take 1 tablet (5 mg total) by mouth once daily   tretinoin (RETIN-A) 0.05 % cream Apply topically at  bedtime.   [DISCONTINUED] Insulin Disposable Pump (OMNIPOD 5 DEXG7G6 PODS GEN 5) MISC Inject 1 each subcutaneously every third day   Insulin Disposable Pump (OMNIPOD 5 G6 PODS, GEN 5,) MISC Inject 1 each into the skin every third day. (Patient not taking: Reported on 03/11/2023)   [DISCONTINUED] Continuous Blood Gluc Sensor (DEXCOM G7 SENSOR) MISC Use 1 each every 10 (ten) days   [DISCONTINUED] Continuous Blood Gluc Sensor (DEXCOM G7 SENSOR) MISC Use 1 each every 10 (ten) days (Patient not taking: Reported on 03/11/2023)   [DISCONTINUED] Continuous Glucose Sensor (DEXCOM G6 SENSOR) MISC Use to monitor blood sugar. Replace every 10 days (Patient not taking: Reported on 03/11/2023)   [DISCONTINUED] Continuous Glucose Transmitter (DEXCOM G6 TRANSMITTER) MISC Use to monitor blood sugar.  Replace every 3 months (Patient not taking: Reported on 03/11/2023)   [DISCONTINUED] insulin lispro (HUMALOG) 100 UNIT/ML injection Inject 0.65 mLs (65 Units total) into the skin daily via insulin pump. (Patient not taking: Reported on 03/11/2023)   [DISCONTINUED] meloxicam (MOBIC) 15 MG tablet Take 1 tablet (15 mg total) by mouth daily. (Patient not taking: Reported on 03/11/2023)   No facility-administered medications prior to visit.    Review of Systems    Objective    BP 102/65 (BP Location: Left Arm, Patient Position: Sitting, Cuff Size: Normal)   Pulse 60   Ht 5\' 4"  (1.626 m)   Wt 126 lb 3.2 oz (57.2 kg)   SpO2 100%   BMI 21.66 kg/m    Physical Exam Vitals reviewed.  Constitutional:      General: She is not in acute distress.    Appearance: Normal appearance. She is well-developed. She is not diaphoretic.  HENT:     Head: Normocephalic and atraumatic.     Right Ear: Tympanic membrane, ear canal and external ear normal.     Left Ear: Tympanic membrane, ear canal and external ear normal.     Nose: Nose normal.     Mouth/Throat:     Mouth: Mucous membranes are moist.     Pharynx: Oropharynx is clear.  No oropharyngeal exudate.  Eyes:     General: No scleral icterus.    Conjunctiva/sclera: Conjunctivae normal.     Pupils: Pupils are equal, round, and reactive to light.  Neck:     Thyroid: No thyromegaly.  Cardiovascular:     Rate and Rhythm: Normal rate and regular rhythm.     Heart sounds: Normal heart sounds. No murmur heard. Pulmonary:     Effort: Pulmonary effort is normal. No respiratory distress.     Breath sounds: Normal breath sounds. No wheezing or rales.  Abdominal:     General: There is no distension.     Palpations: Abdomen is soft.  Tenderness: There is no abdominal tenderness.  Musculoskeletal:        General: No deformity.     Cervical back: Neck supple.     Right lower leg: No edema.     Left lower leg: No edema.  Lymphadenopathy:     Cervical: No cervical adenopathy.  Skin:    General: Skin is warm and dry.     Comments: Onychomycosis of R 4th and 5th toenails  Neurological:     Mental Status: She is alert and oriented to person, place, and time. Mental status is at baseline.     Gait: Gait normal.  Psychiatric:        Mood and Affect: Mood normal.        Behavior: Behavior normal.        Thought Content: Thought content normal.      Results for orders placed or performed in visit on 03/11/23  POC Covid19/Flu A&B Antigen  Result Value Ref Range   Influenza A Antigen, POC Negative Negative   Influenza B Antigen, POC Negative Negative   Covid Antigen, POC Negative Negative    Assessment & Plan    Routine Health Maintenance and Physical Exam  Exercise Activities and Dietary recommendations  Goals   None     Immunization History  Administered Date(s) Administered   Hepatitis B 03/17/1992, 04/17/1992, 09/22/1992   Hepatitis B, ADULT 03/17/1992, 04/17/1992, 09/22/1992   Influenza,inj,Quad PF,6+ Mos 10/06/2016, 10/18/2017, 10/18/2021   Influenza-Unspecified 10/02/2014, 10/06/2016, 10/18/2017, 10/18/2018, 10/23/2019, 09/27/2020, 10/28/2022    MMR 09/21/1996, 08/08/2010   PFIZER(Purple Top)SARS-COV-2 Vaccination 01/22/2019, 02/17/2019, 10/27/2019, 09/27/2020   PNEUMOCOCCAL CONJUGATE-20 03/11/2023   Pneumococcal Polysaccharide-23 10/18/2017   Td 09/21/1996   Tdap 04/26/2007, 11/23/2018   Varicella 09/21/1996   Yellow Fever 05/07/2016   Zoster Recombinant(Shingrix) 03/21/2021, 05/23/2021    Health Maintenance  Topic Date Due   HIV Screening  Never done   COVID-19 Vaccine (5 - 2024-25 season) 09/13/2022   Diabetic kidney evaluation - eGFR measurement  12/30/2022   Diabetic kidney evaluation - Urine ACR  12/30/2022   HEMOGLOBIN A1C  02/12/2023   FOOT EXAM  03/06/2023   OPHTHALMOLOGY EXAM  06/25/2023   Colonoscopy  03/18/2024   MAMMOGRAM  05/13/2024   Cervical Cancer Screening (HPV/Pap Cotest)  02/20/2026   DTaP/Tdap/Td (4 - Td or Tdap) 11/22/2028   Pneumococcal Vaccine 32-2 Years old  Completed   INFLUENZA VACCINE  Completed   Hepatitis C Screening  Completed   Zoster Vaccines- Shingrix  Completed   HPV VACCINES  Aged Out    Discussed health benefits of physical activity, and encouraged her to engage in regular exercise appropriate for her age and condition.  Problem List Items Addressed This Visit       Endocrine   Latent autoimmune diabetes in adults (LADA), managed as type 1 (HCC)   Relevant Orders   Urine Albumin/Creatinine with ratio (send out) [LAB689]   Other Visit Diagnoses       Encounter for annual physical exam    -  Primary   Relevant Orders   Lipid Panel With LDL/HDL Ratio     Type 1 diabetes mellitus without complications (HCC)       Relevant Orders   Comprehensive metabolic panel   Hemoglobin A1c     Need for pneumococcal vaccine       Relevant Orders   Pneumococcal conjugate vaccine 20-valent (Completed)     Exposure to the flu       Relevant Orders  POC Covid19/Flu A&B Antigen (Completed)     Travel advice encounter         Acute pain of left knee       Relevant Orders   MR Knee  Left  Wo Contrast     Effusion of left knee       Relevant Orders   MR Knee Left  Wo Contrast     Onychomycosis       Relevant Medications   terbinafine (LAMISIL) 250 MG tablet   azithromycin (ZITHROMAX) 250 MG tablet   nitrofurantoin, macrocrystal-monohydrate, (MACROBID) 100 MG capsule           Knee Effusion and Medial Knee Pain Meniscus horn tear and patellofemoral arthritis. Postponed knee surgery due to reduced pain after a marathon. Concerned about osteoarthritis progression. Considering MRI to assess changes. Discussed osteoarthritis progression risks and potential future knee replacement. - Order MRI in May to assess arthritis progression - Specify radiologist to evaluate arthritis progression - Monitor knee effusion and medial knee pain  Onychomycosis Chronic fungal infection of toenails. Previously prescribed expensive topical medication. Uncertain if condition is fungal or trauma-related due to running. Has not taken Lamisil before. Discussed continuous Lamisil use for 3 months and monitoring liver enzymes. - Prescribe Lamisil for 3 months - Monitor liver enzymes  LADA Better blood glucose control, fewer instances of blood glucose reaching 200 mg/dL. Managing carbohydrate intake. A1c is 6.3%. On Crestor. Discussed importance of continued carbohydrate management and regular blood glucose monitoring. Maintaining A1c below 7% reduces complication risks. - Continue current diabetes management plan - Check lipid panel - Check A1c - Clean up medication list to remove duplicates  Travel Medication Management Preparing for travel, requires medications for infections, nausea, and asthma. Discussed Z-Pak, Macrobid, Zofran, scopolamine patch, and prednisone taper pack. Emphasized importance of having these medications available during travel. - Prescribe Z-Pak - Prescribe Macrobid for UTIs - Prescribe Zofran for nausea - Prescribe scopolamine patch for motion sickness -  Prescribe prednisone 10 mg taper pack for asthma/knee pain - Ensure she has albuterol  General Health Maintenance Routine health maintenance including vaccinations and lab tests. Discussed importance of regular screenings and vaccinations to prevent illness. - Order CMP, lipid panel, A1c, UACR, and urine tests - Update Prevnar vaccination  Follow-up - Provide lab slips for next week's tests - Schedule follow-up appointment in 6 months.        Return in about 6 months (around 09/08/2023) for chronic disease f/u.     Shirlee Latch, MD  [facility] [phone] (phone) [phone] (fax)  [facility]

## 2023-03-15 ENCOUNTER — Other Ambulatory Visit: Payer: Self-pay

## 2023-03-17 DIAGNOSIS — E139 Other specified diabetes mellitus without complications: Secondary | ICD-10-CM | POA: Diagnosis not present

## 2023-03-17 DIAGNOSIS — Z0000 Encounter for general adult medical examination without abnormal findings: Secondary | ICD-10-CM | POA: Diagnosis not present

## 2023-03-17 DIAGNOSIS — E109 Type 1 diabetes mellitus without complications: Secondary | ICD-10-CM | POA: Diagnosis not present

## 2023-03-18 ENCOUNTER — Other Ambulatory Visit: Payer: Self-pay

## 2023-03-18 ENCOUNTER — Encounter: Payer: Self-pay | Admitting: Family Medicine

## 2023-03-18 LAB — COMPREHENSIVE METABOLIC PANEL
ALT: 32 IU/L (ref 0–32)
AST: 35 IU/L (ref 0–40)
Albumin: 4.7 g/dL (ref 3.8–4.9)
Alkaline Phosphatase: 50 IU/L (ref 44–121)
BUN/Creatinine Ratio: 24 — ABNORMAL HIGH (ref 9–23)
BUN: 16 mg/dL (ref 6–24)
Bilirubin Total: 0.8 mg/dL (ref 0.0–1.2)
CO2: 24 mmol/L (ref 20–29)
Calcium: 9.5 mg/dL (ref 8.7–10.2)
Chloride: 105 mmol/L (ref 96–106)
Creatinine, Ser: 0.68 mg/dL (ref 0.57–1.00)
Globulin, Total: 2.2 g/dL (ref 1.5–4.5)
Glucose: 127 mg/dL — ABNORMAL HIGH (ref 70–99)
Potassium: 3.8 mmol/L (ref 3.5–5.2)
Sodium: 144 mmol/L (ref 134–144)
Total Protein: 6.9 g/dL (ref 6.0–8.5)
eGFR: 105 mL/min/{1.73_m2} (ref >59)

## 2023-03-18 LAB — LIPID PANEL WITH LDL/HDL RATIO
Cholesterol, Total: 168 mg/dL (ref 100–199)
HDL: 81 mg/dL (ref >39)
LDL Chol Calc (NIH): 79 mg/dL (ref 0–99)
LDL/HDL Ratio: 1 ratio (ref 0.0–3.2)
Triglycerides: 35 mg/dL (ref 0–149)
VLDL Cholesterol Cal: 8 mg/dL (ref 5–40)

## 2023-03-18 LAB — MICROALBUMIN / CREATININE URINE RATIO
Creatinine, Urine: 167.6 mg/dL
Microalb/Creat Ratio: 5 mg/g{creat} (ref 0–29)
Microalbumin, Urine: 8.6 ug/mL

## 2023-03-18 LAB — HEMOGLOBIN A1C
Est. average glucose Bld gHb Est-mCnc: 134 mg/dL
Hgb A1c MFr Bld: 6.3 % — ABNORMAL HIGH (ref 4.8–5.6)

## 2023-03-18 MED ORDER — DEXCOM G6 TRANSMITTER MISC
3 refills | Status: AC
  Filled 2023-03-18: qty 1, 90d supply, fill #0
  Filled 2023-06-21: qty 1, 90d supply, fill #1

## 2023-04-16 ENCOUNTER — Other Ambulatory Visit: Payer: Self-pay

## 2023-04-16 MED ORDER — OMNIPOD 5 DEXG7G6 PODS GEN 5 MISC
1.0000 | 11 refills | Status: DC
  Filled 2023-04-16: qty 30, 90d supply, fill #0
  Filled ????-??-??: fill #0

## 2023-04-19 ENCOUNTER — Other Ambulatory Visit: Payer: Self-pay

## 2023-04-21 ENCOUNTER — Other Ambulatory Visit: Payer: Self-pay

## 2023-04-22 ENCOUNTER — Other Ambulatory Visit: Payer: Self-pay

## 2023-04-22 DIAGNOSIS — E782 Mixed hyperlipidemia: Secondary | ICD-10-CM | POA: Diagnosis not present

## 2023-04-22 DIAGNOSIS — E109 Type 1 diabetes mellitus without complications: Secondary | ICD-10-CM | POA: Diagnosis not present

## 2023-04-22 DIAGNOSIS — E1069 Type 1 diabetes mellitus with other specified complication: Secondary | ICD-10-CM | POA: Diagnosis not present

## 2023-04-22 MED ORDER — OMNIPOD 5 DEXG7G6 PODS GEN 5 MISC
4 refills | Status: AC
  Filled 2023-04-22: qty 45, 90d supply, fill #0
  Filled 2023-04-22: qty 30, 60d supply, fill #0
  Filled 2023-07-05 – 2023-07-19 (×2): qty 30, 60d supply, fill #1
  Filled 2023-09-28: qty 30, 60d supply, fill #2
  Filled 2024-01-17: qty 30, 60d supply, fill #0

## 2023-04-22 MED ORDER — ROSUVASTATIN CALCIUM 10 MG PO TABS
10.0000 mg | ORAL_TABLET | Freq: Every day | ORAL | 4 refills | Status: AC
  Filled 2023-04-22: qty 90, 90d supply, fill #0
  Filled 2023-09-28: qty 90, 90d supply, fill #1
  Filled 2024-01-17: qty 90, 90d supply, fill #2

## 2023-05-20 DIAGNOSIS — H9202 Otalgia, left ear: Secondary | ICD-10-CM | POA: Diagnosis not present

## 2023-06-01 ENCOUNTER — Other Ambulatory Visit: Payer: Self-pay

## 2023-06-01 ENCOUNTER — Other Ambulatory Visit: Payer: Self-pay | Admitting: Family Medicine

## 2023-06-01 DIAGNOSIS — Z1231 Encounter for screening mammogram for malignant neoplasm of breast: Secondary | ICD-10-CM

## 2023-06-10 ENCOUNTER — Ambulatory Visit
Admission: RE | Admit: 2023-06-10 | Discharge: 2023-06-10 | Disposition: A | Discharge status: Home / Self Care | Payer: Commercial Managed Care - PPO | Source: Ambulatory Visit | Attending: Family Medicine | Admitting: Family Medicine

## 2023-06-10 DIAGNOSIS — M7122 Synovial cyst of popliteal space [Baker], left knee: Secondary | ICD-10-CM | POA: Diagnosis not present

## 2023-06-10 DIAGNOSIS — M1712 Unilateral primary osteoarthritis, left knee: Secondary | ICD-10-CM | POA: Diagnosis not present

## 2023-06-10 DIAGNOSIS — M25462 Effusion, left knee: Secondary | ICD-10-CM | POA: Insufficient documentation

## 2023-06-10 DIAGNOSIS — M25562 Pain in left knee: Secondary | ICD-10-CM | POA: Diagnosis not present

## 2023-06-10 DIAGNOSIS — S83242A Other tear of medial meniscus, current injury, left knee, initial encounter: Secondary | ICD-10-CM | POA: Diagnosis not present

## 2023-06-15 ENCOUNTER — Other Ambulatory Visit: Payer: Self-pay

## 2023-06-17 ENCOUNTER — Ambulatory Visit
Admission: RE | Admit: 2023-06-17 | Discharge: 2023-06-17 | Disposition: A | Discharge status: Home / Self Care | Source: Ambulatory Visit | Attending: Family Medicine | Admitting: Family Medicine

## 2023-06-17 DIAGNOSIS — Z1231 Encounter for screening mammogram for malignant neoplasm of breast: Secondary | ICD-10-CM | POA: Diagnosis not present

## 2023-06-21 ENCOUNTER — Other Ambulatory Visit: Payer: Self-pay

## 2023-06-21 ENCOUNTER — Ambulatory Visit: Payer: Self-pay | Admitting: Family Medicine

## 2023-06-23 ENCOUNTER — Ambulatory Visit: Payer: Self-pay | Admitting: Family Medicine

## 2023-06-30 DIAGNOSIS — M25562 Pain in left knee: Secondary | ICD-10-CM | POA: Diagnosis not present

## 2023-06-30 DIAGNOSIS — M25561 Pain in right knee: Secondary | ICD-10-CM | POA: Diagnosis not present

## 2023-07-01 DIAGNOSIS — E782 Mixed hyperlipidemia: Secondary | ICD-10-CM | POA: Diagnosis not present

## 2023-07-01 DIAGNOSIS — E109 Type 1 diabetes mellitus without complications: Secondary | ICD-10-CM | POA: Diagnosis not present

## 2023-07-01 DIAGNOSIS — E1069 Type 1 diabetes mellitus with other specified complication: Secondary | ICD-10-CM | POA: Diagnosis not present

## 2023-07-05 ENCOUNTER — Other Ambulatory Visit: Payer: Self-pay

## 2023-07-19 ENCOUNTER — Other Ambulatory Visit: Payer: Self-pay

## 2023-08-30 ENCOUNTER — Other Ambulatory Visit: Payer: Self-pay

## 2023-08-31 ENCOUNTER — Other Ambulatory Visit: Payer: Self-pay

## 2023-08-31 MED ORDER — INSULIN LISPRO 100 UNIT/ML IJ SOLN
65.0000 [IU] | Freq: Every day | INTRAMUSCULAR | 12 refills | Status: AC
  Filled 2023-08-31: qty 20, 30d supply, fill #0
  Filled 2023-09-28: qty 20, 30d supply, fill #1
  Filled 2023-12-07: qty 20, 30d supply, fill #2

## 2023-09-23 ENCOUNTER — Telehealth: Payer: Self-pay

## 2023-09-23 NOTE — Telephone Encounter (Signed)
 Pharmacy Patient Advocate Encounter  Received notification from [employer] that Prior Authorization for Lidocaine 5% patches has been APPROVED from 09/21/23 to 01/12/24  Approval letter indexed to media tab

## 2023-09-28 ENCOUNTER — Other Ambulatory Visit: Payer: Self-pay

## 2023-09-28 MED ORDER — DEXCOM G7 SENSOR MISC
1.0000 | 4 refills | Status: AC
  Filled 2023-09-28 – 2024-01-17 (×2): qty 9, 90d supply, fill #0

## 2023-10-08 ENCOUNTER — Encounter: Payer: Self-pay | Admitting: Family Medicine

## 2023-10-08 DIAGNOSIS — E559 Vitamin D deficiency, unspecified: Secondary | ICD-10-CM

## 2023-10-08 DIAGNOSIS — Z862 Personal history of diseases of the blood and blood-forming organs and certain disorders involving the immune mechanism: Secondary | ICD-10-CM

## 2023-10-08 DIAGNOSIS — E139 Other specified diabetes mellitus without complications: Secondary | ICD-10-CM

## 2023-10-08 DIAGNOSIS — R5382 Chronic fatigue, unspecified: Secondary | ICD-10-CM

## 2023-10-12 NOTE — Telephone Encounter (Signed)
 Ok to add

## 2023-10-18 DIAGNOSIS — R5382 Chronic fatigue, unspecified: Secondary | ICD-10-CM | POA: Diagnosis not present

## 2023-10-18 DIAGNOSIS — E139 Other specified diabetes mellitus without complications: Secondary | ICD-10-CM | POA: Diagnosis not present

## 2023-10-18 DIAGNOSIS — Z862 Personal history of diseases of the blood and blood-forming organs and certain disorders involving the immune mechanism: Secondary | ICD-10-CM | POA: Diagnosis not present

## 2023-10-18 DIAGNOSIS — E559 Vitamin D deficiency, unspecified: Secondary | ICD-10-CM | POA: Diagnosis not present

## 2023-10-19 ENCOUNTER — Ambulatory Visit: Payer: Self-pay | Admitting: Family Medicine

## 2023-10-19 LAB — CBC WITH DIFFERENTIAL/PLATELET
Basophils Absolute: 0 x10E3/uL (ref 0.0–0.2)
Basos: 0 %
EOS (ABSOLUTE): 0 x10E3/uL (ref 0.0–0.4)
Eos: 1 %
Hematocrit: 41.6 % (ref 34.0–46.6)
Hemoglobin: 13.7 g/dL (ref 11.1–15.9)
Immature Grans (Abs): 0 x10E3/uL (ref 0.0–0.1)
Immature Granulocytes: 0 %
Lymphocytes Absolute: 1.8 x10E3/uL (ref 0.7–3.1)
Lymphs: 25 %
MCH: 29.9 pg (ref 26.6–33.0)
MCHC: 32.9 g/dL (ref 31.5–35.7)
MCV: 91 fL (ref 79–97)
Monocytes Absolute: 0.6 x10E3/uL (ref 0.1–0.9)
Monocytes: 9 %
Neutrophils Absolute: 4.9 x10E3/uL (ref 1.4–7.0)
Neutrophils: 65 %
Platelets: 184 x10E3/uL (ref 150–450)
RBC: 4.58 x10E6/uL (ref 3.77–5.28)
RDW: 11.8 % (ref 11.7–15.4)
WBC: 7.5 x10E3/uL (ref 3.4–10.8)

## 2023-10-19 LAB — IRON,TIBC AND FERRITIN PANEL
Ferritin: 68 ng/mL (ref 15–150)
Iron Saturation: 22 % (ref 15–55)
Iron: 68 ug/dL (ref 27–159)
Total Iron Binding Capacity: 309 ug/dL (ref 250–450)
UIBC: 241 ug/dL (ref 131–425)

## 2023-10-19 LAB — COMPREHENSIVE METABOLIC PANEL WITH GFR
ALT: 28 IU/L (ref 0–32)
AST: 31 IU/L (ref 0–40)
Albumin: 4.4 g/dL (ref 3.8–4.9)
Alkaline Phosphatase: 64 IU/L (ref 49–135)
BUN/Creatinine Ratio: 15 (ref 9–23)
BUN: 13 mg/dL (ref 6–24)
Bilirubin Total: 0.4 mg/dL (ref 0.0–1.2)
CO2: 26 mmol/L (ref 20–29)
Calcium: 9.7 mg/dL (ref 8.7–10.2)
Chloride: 103 mmol/L (ref 96–106)
Creatinine, Ser: 0.88 mg/dL (ref 0.57–1.00)
Globulin, Total: 2.3 g/dL (ref 1.5–4.5)
Glucose: 93 mg/dL (ref 70–99)
Potassium: 4.2 mmol/L (ref 3.5–5.2)
Sodium: 143 mmol/L (ref 134–144)
Total Protein: 6.7 g/dL (ref 6.0–8.5)
eGFR: 79 mL/min/1.73 (ref >59)

## 2023-10-19 LAB — LIPID PANEL
Chol/HDL Ratio: 2.4 ratio (ref 0.0–4.4)
Cholesterol, Total: 146 mg/dL (ref 100–199)
HDL: 61 mg/dL (ref >39)
LDL Chol Calc (NIH): 76 mg/dL (ref 0–99)
Triglycerides: 40 mg/dL (ref 0–149)
VLDL Cholesterol Cal: 9 mg/dL (ref 5–40)

## 2023-10-19 LAB — VITAMIN D 25 HYDROXY (VIT D DEFICIENCY, FRACTURES): Vit D, 25-Hydroxy: 42.2 ng/mL (ref 30.0–100.0)

## 2023-10-19 LAB — HEMOGLOBIN A1C
Est. average glucose Bld gHb Est-mCnc: 131 mg/dL
Hgb A1c MFr Bld: 6.2 % — ABNORMAL HIGH (ref 4.8–5.6)

## 2023-10-19 LAB — VITAMIN B12: Vitamin B-12: 806 pg/mL (ref 232–1245)

## 2023-10-19 LAB — TSH: TSH: 2.2 u[IU]/mL (ref 0.450–4.500)

## 2023-10-20 DIAGNOSIS — E1069 Type 1 diabetes mellitus with other specified complication: Secondary | ICD-10-CM | POA: Diagnosis not present

## 2023-10-20 DIAGNOSIS — E782 Mixed hyperlipidemia: Secondary | ICD-10-CM | POA: Diagnosis not present

## 2023-10-20 DIAGNOSIS — E109 Type 1 diabetes mellitus without complications: Secondary | ICD-10-CM | POA: Diagnosis not present

## 2023-12-07 ENCOUNTER — Other Ambulatory Visit: Payer: Self-pay

## 2024-01-17 ENCOUNTER — Other Ambulatory Visit: Payer: Self-pay

## 2024-01-17 ENCOUNTER — Other Ambulatory Visit (HOSPITAL_COMMUNITY): Payer: Self-pay

## 2024-01-19 ENCOUNTER — Other Ambulatory Visit: Payer: Self-pay
